# Patient Record
Sex: Female | Born: 1980
Health system: Southern US, Community
[De-identification: ages and names within clinical notes are randomized; demographics above are authoritative.]

## PROBLEM LIST (undated history)

## (undated) DIAGNOSIS — R52 Pain, unspecified: Secondary | ICD-10-CM

## (undated) DIAGNOSIS — U071 COVID-19: Secondary | ICD-10-CM

## (undated) DIAGNOSIS — M5430 Sciatica, unspecified side: Secondary | ICD-10-CM

## (undated) DIAGNOSIS — G43909 Migraine, unspecified, not intractable, without status migrainosus: Secondary | ICD-10-CM

## (undated) DIAGNOSIS — M5126 Other intervertebral disc displacement, lumbar region: Secondary | ICD-10-CM

## (undated) DIAGNOSIS — F909 Attention-deficit hyperactivity disorder, unspecified type: Secondary | ICD-10-CM

## (undated) DIAGNOSIS — I10 Essential (primary) hypertension: Secondary | ICD-10-CM

## (undated) HISTORY — DX: Pain, unspecified: R52

## (undated) HISTORY — DX: Migraine, unspecified, not intractable, without status migrainosus: G43.909

## (undated) HISTORY — PX: TUBAL LIGATION: SHX77

## (undated) HISTORY — DX: COVID-19: U07.1

## (undated) HISTORY — DX: Sciatica, unspecified side: M54.30

## (undated) HISTORY — DX: Other intervertebral disc displacement, lumbar region: M51.26

## (undated) HISTORY — DX: Attention-deficit hyperactivity disorder, unspecified type: F90.9

## (undated) HISTORY — PX: HYSTEROSCOPY: SHX211

## (undated) HISTORY — PX: KNEE ARTHROSCOPY: SUR90

---

## 1998-03-22 ENCOUNTER — Ambulatory Visit (HOSPITAL_COMMUNITY): Admission: RE | Admit: 1998-03-22 | Discharge: 1998-03-22 | Payer: Self-pay | Admitting: Obstetrics

## 1998-04-15 ENCOUNTER — Inpatient Hospital Stay (HOSPITAL_COMMUNITY): Admission: AD | Admit: 1998-04-15 | Discharge: 1998-04-15 | Payer: Self-pay | Admitting: *Deleted

## 1998-07-30 ENCOUNTER — Ambulatory Visit (HOSPITAL_COMMUNITY): Admission: RE | Admit: 1998-07-30 | Discharge: 1998-07-30 | Payer: Self-pay | Admitting: Obstetrics

## 1998-08-08 ENCOUNTER — Inpatient Hospital Stay (HOSPITAL_COMMUNITY): Admission: AD | Admit: 1998-08-08 | Discharge: 1998-08-11 | Payer: Self-pay | Admitting: Obstetrics

## 1998-08-12 ENCOUNTER — Inpatient Hospital Stay (HOSPITAL_COMMUNITY): Admission: AD | Admit: 1998-08-12 | Discharge: 1998-08-12 | Payer: Self-pay | Admitting: Obstetrics

## 2002-09-17 ENCOUNTER — Emergency Department (HOSPITAL_COMMUNITY): Admission: EM | Admit: 2002-09-17 | Discharge: 2002-09-17 | Payer: Self-pay | Admitting: Emergency Medicine

## 2004-04-19 ENCOUNTER — Emergency Department (HOSPITAL_COMMUNITY): Admission: EM | Admit: 2004-04-19 | Discharge: 2004-04-19 | Payer: Self-pay | Admitting: Family Medicine

## 2005-04-05 ENCOUNTER — Emergency Department: Payer: Self-pay | Admitting: Unknown Physician Specialty

## 2005-07-23 ENCOUNTER — Emergency Department: Payer: Self-pay | Admitting: Emergency Medicine

## 2009-02-13 ENCOUNTER — Ambulatory Visit: Payer: Self-pay | Admitting: Unknown Physician Specialty

## 2009-08-03 ENCOUNTER — Ambulatory Visit: Payer: Self-pay | Admitting: Obstetrics and Gynecology

## 2009-08-14 ENCOUNTER — Ambulatory Visit: Payer: Self-pay | Admitting: Anesthesiology

## 2009-10-04 ENCOUNTER — Ambulatory Visit: Payer: Self-pay | Admitting: Anesthesiology

## 2009-11-15 ENCOUNTER — Ambulatory Visit: Payer: Self-pay | Admitting: Anesthesiology

## 2010-01-04 ENCOUNTER — Ambulatory Visit: Payer: Self-pay | Admitting: Anesthesiology

## 2010-04-18 ENCOUNTER — Ambulatory Visit: Payer: Self-pay | Admitting: Anesthesiology

## 2010-06-28 ENCOUNTER — Ambulatory Visit: Payer: Self-pay | Admitting: Anesthesiology

## 2010-12-02 ENCOUNTER — Ambulatory Visit: Payer: Self-pay | Admitting: Anesthesiology

## 2016-09-16 DIAGNOSIS — J4540 Moderate persistent asthma, uncomplicated: Secondary | ICD-10-CM | POA: Diagnosis not present

## 2016-11-03 DIAGNOSIS — Z01419 Encounter for gynecological examination (general) (routine) without abnormal findings: Secondary | ICD-10-CM | POA: Diagnosis not present

## 2016-11-03 DIAGNOSIS — Z124 Encounter for screening for malignant neoplasm of cervix: Secondary | ICD-10-CM | POA: Diagnosis not present

## 2016-11-03 DIAGNOSIS — Z13220 Encounter for screening for lipoid disorders: Secondary | ICD-10-CM | POA: Diagnosis not present

## 2016-11-03 DIAGNOSIS — Z131 Encounter for screening for diabetes mellitus: Secondary | ICD-10-CM | POA: Diagnosis not present

## 2016-11-03 LAB — HM PAP SMEAR: HM PAP: NEGATIVE

## 2016-12-17 DIAGNOSIS — J40 Bronchitis, not specified as acute or chronic: Secondary | ICD-10-CM | POA: Diagnosis not present

## 2017-03-21 DIAGNOSIS — H52223 Regular astigmatism, bilateral: Secondary | ICD-10-CM | POA: Diagnosis not present

## 2017-08-27 ENCOUNTER — Ambulatory Visit: Payer: Self-pay | Admitting: Physician Assistant

## 2017-08-27 DIAGNOSIS — S92502A Displaced unspecified fracture of left lesser toe(s), initial encounter for closed fracture: Secondary | ICD-10-CM | POA: Diagnosis not present

## 2017-09-11 DIAGNOSIS — S92502A Displaced unspecified fracture of left lesser toe(s), initial encounter for closed fracture: Secondary | ICD-10-CM | POA: Diagnosis not present

## 2017-09-14 ENCOUNTER — Ambulatory Visit: Payer: Self-pay | Admitting: Physician Assistant

## 2017-09-14 ENCOUNTER — Encounter: Payer: Self-pay | Admitting: Physician Assistant

## 2017-09-14 VITALS — BP 160/92 | HR 74 | Temp 97.5°F

## 2017-09-14 DIAGNOSIS — J069 Acute upper respiratory infection, unspecified: Secondary | ICD-10-CM

## 2017-09-14 MED ORDER — FLUCONAZOLE 150 MG PO TABS: ORAL_TABLET | ORAL | 0 refills | Status: DC

## 2017-09-14 MED ORDER — AZITHROMYCIN 250 MG PO TABS: ORAL_TABLET | ORAL | 0 refills | Status: DC

## 2017-09-14 MED ORDER — PREDNISONE 10 MG (21) PO TBPK: ORAL_TABLET | ORAL | 0 refills | Status: DC

## 2017-09-14 NOTE — Progress Notes (Signed)
S: C/o runny nose and congestion for 3 -4 days, no fever, chills, cp/sob, v/d; mucus was green this am, has ear pain, cough is harsh, going on a cruise in 5 days, worried she will be sick while on the cruise  Using otc meds: dayquil/nyquil  O: PE: vitals wnl, nad, perrl eomi, normocephalic, tms dull, nasal mucosa red and swollen, throat injected, neck supple no lymph, lungs c t a, cv rrr, neuro intact, cough is harsh  A:  Acute  uri   P: drink fluids, continue regular meds , use otc meds of choice, return if not improving in 5 days, return earlier if worsening , zpack, sterapred ds 10mg  6 day dose pack, diflucan

## 2017-09-28 DIAGNOSIS — M533 Sacrococcygeal disorders, not elsewhere classified: Secondary | ICD-10-CM | POA: Insufficient documentation

## 2017-09-28 DIAGNOSIS — S92502A Displaced unspecified fracture of left lesser toe(s), initial encounter for closed fracture: Secondary | ICD-10-CM | POA: Diagnosis not present

## 2017-12-15 NOTE — Progress Notes (Signed)
Subjective:    Patient ID: Sylvia Warren, female    DOB: [birth year], 37 y.o.   MRN: [id number]  HPI:  Sylvia Warren is here to establish as a new pt.  She is a pleasant 37 year old female.  PMH: Migraine HA, Obesity, elevated BP, and tobacco use disorder.  Her BP has been >140/90 the last few encounters with healthcare providers and even caused blurred vision a few weeks ago.  She estimates to drink "pot of coffe/day" and smokes pack/day for the >18 years.  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 She previously worked 7p-7a for >10 years She eats a diet high in saturated fat/sugar/Na+ and denies any regular exercise- however will stand/walk quite a lot while at work. She denies issues with depression/anxiety and reports restful sleep. She is married with 3 children at home, ages 53,18,15   Patient Care Team    Relationship Specialty Notifications Start End  Dunmor, Jinny Blossom, NP PCP - General Family Medicine  12/16/17     There are no active problems to display for this patient.    History reviewed. No pertinent past medical history.   Past Surgical History:  Procedure Laterality Date  . CESAREAN SECTION    . KNEE ARTHROSCOPY    . TUBAL LIGATION       Family History  Problem Relation Age of Onset  . Hypertension Mother   . Cirrhosis Mother   . Kidney disease Mother   . Healthy Sister   . Healthy Daughter   . Healthy Son   . Cancer Maternal Aunt        vulvular  . Diabetes Paternal Grandmother   . Healthy Sister   . Healthy Sister   . Healthy Son      Social History   Substance and Sexual Activity  Drug Use No     Social History   Substance and Sexual Activity  Alcohol Use No  . Frequency: Never     Social History   Tobacco Use  Smoking Status Current Every Day Smoker  . Packs/day: 1.00  . Years: 18.00  . Pack years: 18.00  . Types: Cigarettes  Smokeless Tobacco Never Used      Outpatient Encounter Medications as of 12/16/2017  Medication Sig  . rizatriptan (MAXALT) 5 MG tablet Take 5 mg by mouth as needed for migraine. May repeat in 2 hours if needed  . [DISCONTINUED] azithromycin (ZITHROMAX Z-PAK) 250 MG tablet 2 pills today then 1 pill a day for 4 days  . [DISCONTINUED] fluconazole (DIFLUCAN) 150 MG tablet Take one now and one in a week  . [DISCONTINUED] HYDROcodone-acetaminophen (NORCO/VICODIN) 5-325 MG tablet   . [DISCONTINUED] meloxicam (MOBIC) 15 MG tablet Mobic 15 mg tablet  Take 1 tablet every day by oral route.  . [DISCONTINUED] predniSONE (STERAPRED UNI-PAK 21 TAB) 10 MG (21) TBPK tablet Take 6 pills on day one then decrease by 1 pill each day   No facility-administered encounter medications on file as of 12/16/2017.     Allergies: Patient has no known allergies.  Body mass index is 38.75 kg/m.  Blood pressure (!) 150/91, pulse 75, height 5' 6.25" (1.683 m), weight 241 lb 14.4 oz (109.7 kg), last menstrual period 11/26/2017, SpO2 98 %.    Review of Systems  Constitutional: Positive for fatigue. Negative for activity change, appetite change, chills, diaphoresis, fever and unexpected weight change.  HENT: Negative for congestion.   Eyes:  Positive for visual disturbance.  Respiratory: Negative for cough, chest tightness, shortness of breath, wheezing and stridor.   Cardiovascular: Negative for chest pain, palpitations and leg swelling.  Gastrointestinal: Negative for abdominal distention, abdominal pain, blood in stool, constipation, diarrhea, nausea and vomiting.  Endocrine: Negative for cold intolerance, heat intolerance, polydipsia, polyphagia and polyuria.  Genitourinary: Negative for difficulty urinating and flank pain.  Skin: Negative for color change, pallor, rash and wound.  Neurological: Positive for headaches. Negative for dizziness.  Psychiatric/Behavioral: Negative for decreased concentration, dysphoric mood, hallucinations,  self-injury, sleep disturbance and suicidal ideas. The patient is not nervous/anxious and is not hyperactive.        Objective:   Physical Exam  Constitutional: She is oriented to person, place, and time. She appears well-developed and well-nourished. No distress.  HENT:  Head: Normocephalic and atraumatic.  Right Ear: External ear normal.  Left Ear: External ear normal.  Eyes: Conjunctivae are normal. Pupils are equal, round, and reactive to light.  Cardiovascular: Normal rate, regular rhythm, normal heart sounds and intact distal pulses.  No murmur heard. Pulmonary/Chest: Effort normal and breath sounds normal. No respiratory distress. She has no wheezes. She has no rales. She exhibits no tenderness.  Neurological: She is alert and oriented to person, place, and time.  Skin: Skin is warm and dry. No rash noted. She is not diaphoretic. No erythema. No pallor.  Psychiatric: She has a normal mood and affect. Her behavior is normal. Judgment and thought content normal.  Nursing note and vitals reviewed.         Assessment & Plan:   1. Elevated blood pressure reading   2. HTN, goal below 140/90   3. Tobacco use disorder   4. Healthcare maintenance     HTN, goal below 140/90 BP 150/91, HR 75 Her BPs have been running well over goal the last 18months Last BP listed in system 10/18- 160/92, HR 74 CMP drawn today Started on Lisinopril 10mg  daily Encouraged to stop tobacco use and reduce Na+ and caffeine intake.   Healthcare maintenance Please start Lisinopril 10mg  daily and check your BP at least once/day-please bring log to follow-up in 4 weeks. Please use Nicoderm as directed. Increase water intake, strive for at least 120 ounces/day.   Follow Heart Healthy diet Increase regular exercise, recommend  Low impact activities- walking, stretching,  YouTube/Pinterest workout videos. Please schedule fasting lab appt in 3 weeks and physical in 4 weeks.  Tobacco use  disorder Start Nicoderm patch     FOLLOW-UP:  Return in about 4 weeks (around 01/13/2018) for CPE.

## 2017-12-16 ENCOUNTER — Encounter: Payer: Self-pay | Admitting: Adult Health

## 2017-12-16 ENCOUNTER — Ambulatory Visit (INDEPENDENT_AMBULATORY_CARE_PROVIDER_SITE_OTHER): Payer: No Typology Code available for payment source | Admitting: Adult Health

## 2017-12-16 VITALS — BP 150/91 | HR 75 | Ht 66.25 in | Wt 241.9 lb

## 2017-12-16 DIAGNOSIS — F17200 Nicotine dependence, unspecified, uncomplicated: Secondary | ICD-10-CM | POA: Diagnosis not present

## 2017-12-16 DIAGNOSIS — I10 Essential (primary) hypertension: Secondary | ICD-10-CM | POA: Diagnosis not present

## 2017-12-16 DIAGNOSIS — R030 Elevated blood-pressure reading, without diagnosis of hypertension: Secondary | ICD-10-CM | POA: Diagnosis not present

## 2017-12-16 DIAGNOSIS — Z0000 Encounter for general adult medical examination without abnormal findings: Secondary | ICD-10-CM | POA: Diagnosis not present

## 2017-12-16 MED ORDER — NICOTINE 14 MG/24HR TD PT24
14.0000 mg | MEDICATED_PATCH | Freq: Every day | TRANSDERMAL | 0 refills | Status: DC
Start: 2017-12-16 — End: 2018-06-16

## 2017-12-16 MED ORDER — LISINOPRIL 10 MG PO TABS: 10.0000 mg | ORAL_TABLET | Freq: Every day | ORAL | 0 refills | Status: DC

## 2017-12-16 NOTE — Assessment & Plan Note (Signed)
Please start Lisinopril 10mg  daily and check your BP at least once/day-please bring log to follow-up in 4 weeks. Please use Nicoderm as directed. Increase water intake, strive for at least 120 ounces/day.   Follow Heart Healthy diet Increase regular exercise, recommend  Low impact activities- walking, stretching,  YouTube/Pinterest workout videos. Please schedule fasting lab appt in 3 weeks and physical in 4 weeks.

## 2017-12-16 NOTE — Assessment & Plan Note (Signed)
Start Nicoderm patch  

## 2017-12-16 NOTE — Assessment & Plan Note (Signed)
BP 150/91, HR 75 Her BPs have been running well over goal the last 18months Last BP listed in system 10/18- 160/92, HR 74 CMP drawn today Started on Lisinopril 10mg  daily Encouraged to stop tobacco use and reduce Na+ and caffeine intake.

## 2017-12-16 NOTE — Patient Instructions (Signed)
Heart-Healthy Eating Plan Many factors influence your heart health, including eating and exercise habits. Heart (coronary) risk increases with abnormal blood fat (lipid) levels. Heart-healthy meal planning includes limiting unhealthy fats, increasing healthy fats, and making other small dietary changes. This includes maintaining a healthy body weight to help keep lipid levels within a normal range. What is my plan? Your health care provider recommends that you:  Get no more than ___25__% of the total calories in your daily diet from fat.  Limit your intake of saturated fat to less than ___5__% of your total calories each day.  Limit the amount of cholesterol in your diet to less than _300___ mg per day.  What types of fat should I choose?  Choose healthy fats more often. Choose monounsaturated and polyunsaturated fats, such as olive oil and canola oil, flaxseeds, walnuts, almonds, and seeds.  Eat more omega-3 fats. Good choices include salmon, mackerel, sardines, tuna, flaxseed oil, and ground flaxseeds. Aim to eat fish at least two times each week.  Limit saturated fats. Saturated fats are primarily found in animal products, such as meats, butter, and cream. Plant sources of saturated fats include palm oil, palm kernel oil, and coconut oil.  Avoid foods with partially hydrogenated oils in them. These contain trans fats. Examples of foods that contain trans fats are stick margarine, some tub margarines, cookies, crackers, and other baked goods. What general guidelines do I need to follow?  Check food labels carefully to identify foods with trans fats or high amounts of saturated fat.  Fill one half of your plate with vegetables and green salads. Eat 4-5 servings of vegetables per day. A serving of vegetables equals 1 cup of raw leafy vegetables,  cup of raw or cooked cut-up vegetables, or  cup of vegetable juice.  Fill one fourth of your plate with whole grains. Look for the word "whole"  as the first word in the ingredient list.  Fill one fourth of your plate with lean protein foods.  Eat 4-5 servings of fruit per day. A serving of fruit equals one medium whole fruit,  cup of dried fruit,  cup of fresh, frozen, or canned fruit, or  cup of 100% fruit juice.  Eat more foods that contain soluble fiber. Examples of foods that contain this type of fiber are apples, broccoli, carrots, beans, peas, and barley. Aim to get 20-30 g of fiber per day.  Eat more home-cooked food and less restaurant, buffet, and fast food.  Limit or avoid alcohol.  Limit foods that are high in starch and sugar.  Avoid fried foods.  Cook foods by using methods other than frying. Baking, boiling, grilling, and broiling are all great options. Other fat-reducing suggestions include: ? Removing the skin from poultry. ? Removing all visible fats from meats. ? Skimming the fat off of stews, soups, and gravies before serving them. ? Steaming vegetables in water or broth.  Lose weight if you are overweight. Losing just 5-10% of your initial body weight can help your overall health and prevent diseases such as diabetes and heart disease.  Increase your consumption of nuts, legumes, and seeds to 4-5 servings per week. One serving of dried beans or legumes equals  cup after being cooked, one serving of nuts equals 1 ounces, and one serving of seeds equals  ounce or 1 tablespoon.  You may need to monitor your salt (sodium) intake, especially if you have high blood pressure. Talk with your health care provider or dietitian to get  more information about reducing sodium. What foods can I eat? Grains  Breads, including [place], white, pita, wheat, raisin, rye, oatmeal, and [place]. Tortillas that are neither fried nor made with lard or trans fat. Low-fat rolls, including hotdog and hamburger buns and English muffins. Biscuits. Muffins. Waffles. Pancakes. Light popcorn. Whole-grain cereals. Flatbread. Melba  toast. Pretzels. Breadsticks. Rusks. Low-fat snacks and crackers, including oyster, saltine, matzo, graham, animal, and rye. Rice and pasta, including brown rice and those that are made with whole wheat. Vegetables All vegetables. Fruits All fruits, but limit coconut. Meats and Other Protein Sources Lean, well-trimmed beef, veal, pork, and lamb. Chicken and [place] without skin. All fish and shellfish. Wild duck, rabbit, pheasant, and venison. Egg whites or low-cholesterol egg substitutes. Dried beans, peas, lentils, and tofu.Seeds and most nuts. Dairy Low-fat or nonfat cheeses, including ricotta, string, and mozzarella. Skim or 1% milk that is liquid, powdered, or evaporated. Buttermilk that is made with low-fat milk. Nonfat or low-fat yogurt. Beverages Mineral water. Diet carbonated beverages. Sweets and Desserts Sherbets and fruit ices. Honey, jam, marmalade, jelly, and syrups. Meringues and gelatins. Pure sugar candy, such as hard candy, jelly beans, gumdrops, mints, marshmallows, and small amounts of dark chocolate. [employer] Eat all sweets and desserts in moderation. Fats and Oils Nonhydrogenated (trans-free) margarines. Vegetable oils, including soybean, sesame, sunflower, olive, peanut, safflower, corn, canola, and cottonseed. Salad dressings or mayonnaise that are made with a vegetable oil. Limit added fats and oils that you use for cooking, baking, salads, and as spreads. Other Cocoa powder. Coffee and tea. All seasonings and condiments. The items listed above may not be a complete list of recommended foods or beverages. Contact your dietitian for more options. What foods are not recommended? Grains Breads that are made with saturated or trans fats, oils, or whole milk. Croissants. Butter rolls. Cheese breads. Sweet rolls. Donuts. Buttered popcorn. Chow mein noodles. High-fat crackers, such as cheese or butter crackers. Meats and Other Protein Sources Fatty meats, such as  hotdogs, short ribs, sausage, spareribs, bacon, ribeye roast or steak, and mutton. High-fat deli meats, such as salami and bologna. Caviar. Domestic duck and goose. Organ meats, such as kidney, liver, sweetbreads, brains, gizzard, chitterlings, and heart. Dairy Cream, sour cream, cream cheese, and creamed cottage cheese. Whole milk cheeses, including blue (bleu), Monterey Jack, [place], [place], American, [place], Swiss, [place], [place], and [place]. Whole or 2% milk that is liquid, evaporated, or condensed. Whole buttermilk. Cream sauce or high-fat cheese sauce. Yogurt that is made from whole milk. Beverages Regular sodas and drinks with added sugar. Sweets and Desserts Frosting. Pudding. Cookies. Cakes other than angel food cake. Candy that has milk chocolate or white chocolate, hydrogenated fat, butter, coconut, or unknown ingredients. Buttered syrups. Full-fat ice cream or ice cream drinks. Fats and Oils Gravy that has suet, meat fat, or shortening. Cocoa butter, hydrogenated oils, palm oil, coconut oil, palm kernel oil. These can often be found in baked products, candy, fried foods, nondairy creamers, and whipped toppings. Solid fats and shortenings, including bacon fat, salt pork, lard, and butter. Nondairy cream substitutes, such as coffee creamers and sour cream substitutes. Salad dressings that are made of unknown oils, cheese, or sour cream. The items listed above may not be a complete list of foods and beverages to avoid. Contact your dietitian for more information. This information is not intended to replace advice given to you by your health care provider. Make sure you discuss any questions you have with your health care  provider. Document Released: 08/12/2008 Document Revised: 05/23/2016 Document Reviewed: 04/27/2014 Elsevier Interactive Patient Education  2018 [employer].   Hypertension Hypertension, commonly called high blood pressure, is when the force of blood pumping through the  arteries is too strong. The arteries are the blood vessels that carry blood from the heart throughout the body. Hypertension forces the heart to work harder to pump blood and may cause arteries to become narrow or stiff. Having untreated or uncontrolled hypertension can cause heart attacks, strokes, kidney disease, and other problems. A blood pressure reading consists of a higher number over a lower number. Ideally, your blood pressure should be below 120/80. The first ("top") number is called the systolic pressure. It is a measure of the pressure in your arteries as your heart beats. The second ("bottom") number is called the diastolic pressure. It is a measure of the pressure in your arteries as the heart relaxes. What are the causes? The cause of this condition is not known. What increases the risk? Some risk factors for high blood pressure are under your control. Others are not. Factors you can change  Smoking.  Having type 2 diabetes mellitus, high cholesterol, or both.  Not getting enough exercise or physical activity.  Being overweight.  Having too much fat, sugar, calories, or salt (sodium) in your diet.  Drinking too much alcohol. Factors that are difficult or impossible to change  Having chronic kidney disease.  Having a family history of high blood pressure.  Age. Risk increases with age.  Race. You may be at higher risk if you are African-American.  Gender. Men are at higher risk than women before age 58. After age 61, women are at higher risk than men.  Having obstructive sleep apnea.  Stress. What are the signs or symptoms? Extremely high blood pressure (hypertensive crisis) may cause:  Headache.  Anxiety.  Shortness of breath.  Nosebleed.  Nausea and vomiting.  Severe chest pain.  Jerky movements you cannot control (seizures).  How is this diagnosed? This condition is diagnosed by measuring your blood pressure while you are seated, with your arm  resting on a surface. The cuff of the blood pressure monitor will be placed directly against the skin of your upper arm at the level of your heart. It should be measured at least twice using the same arm. Certain conditions can cause a difference in blood pressure between your right and left arms. Certain factors can cause blood pressure readings to be lower or higher than normal (elevated) for a short period of time:  When your blood pressure is higher when you are in a health care provider's office than when you are at home, this is called white coat hypertension. Most people with this condition do not need medicines.  When your blood pressure is higher at home than when you are in a health care provider's office, this is called masked hypertension. Most people with this condition may need medicines to control blood pressure.  If you have a high blood pressure reading during one visit or you have normal blood pressure with other risk factors:  You may be asked to return on a different day to have your blood pressure checked again.  You may be asked to monitor your blood pressure at home for 1 week or longer.  If you are diagnosed with hypertension, you may have other blood or imaging tests to help your health care provider understand your overall risk for other conditions. How is this treated? This  condition is treated by making healthy lifestyle changes, such as eating healthy foods, exercising more, and reducing your alcohol intake. Your health care provider may prescribe medicine if lifestyle changes are not enough to get your blood pressure under control, and if:  Your systolic blood pressure is above 130.  Your diastolic blood pressure is above 80.  Your personal target blood pressure may vary depending on your medical conditions, your age, and other factors. Follow these instructions at home: Eating and drinking  Eat a diet that is high in fiber and potassium, and low in sodium,  added sugar, and fat. An example eating plan is called the DASH (Dietary Approaches to Stop Hypertension) diet. To eat this way: ? Eat plenty of fresh fruits and vegetables. Try to fill half of your plate at each meal with fruits and vegetables. ? Eat whole grains, such as whole wheat pasta, brown rice, or whole grain bread. Fill about one quarter of your plate with whole grains. ? Eat or drink low-fat dairy products, such as skim milk or low-fat yogurt. ? Avoid fatty cuts of meat, processed or cured meats, and poultry with skin. Fill about one quarter of your plate with lean proteins, such as fish, chicken without skin, beans, eggs, and tofu. ? Avoid premade and processed foods. These tend to be higher in sodium, added sugar, and fat.  Reduce your daily sodium intake. Most people with hypertension should eat less than 1,500 mg of sodium a day.  Limit alcohol intake to no more than 1 drink a day for nonpregnant women and 2 drinks a day for men. One drink equals 12 oz of beer, 5 oz of wine, or 1 oz of hard liquor. Lifestyle  Work with your health care provider to maintain a healthy body weight or to lose weight. Ask what an ideal weight is for you.  Get at least 30 minutes of exercise that causes your heart to beat faster (aerobic exercise) most days of the week. Activities may include walking, swimming, or biking.  Include exercise to strengthen your muscles (resistance exercise), such as pilates or lifting weights, as part of your weekly exercise routine. Try to do these types of exercises for 30 minutes at least 3 days a week.  Do not use any products that contain nicotine or tobacco, such as cigarettes and e-cigarettes. If you need help quitting, ask your health care provider.  Monitor your blood pressure at home as told by your health care provider.  Keep all follow-up visits as told by your health care provider. This is important. Medicines  Take over-the-counter and prescription  medicines only as told by your health care provider. Follow directions carefully. Blood pressure medicines must be taken as prescribed.  Do not skip doses of blood pressure medicine. Doing this puts you at risk for problems and can make the medicine less effective.  Ask your health care provider about side effects or reactions to medicines that you should watch for. Contact a health care provider if:  You think you are having a reaction to a medicine you are taking.  You have headaches that keep coming back (recurring).  You feel dizzy.  You have swelling in your ankles.  You have trouble with your vision. Get help right away if:  You develop a severe headache or confusion.  You have unusual weakness or numbness.  You feel faint.  You have severe pain in your chest or abdomen.  You vomit repeatedly.  You have trouble breathing.  Summary  Hypertension is when the force of blood pumping through your arteries is too strong. If this condition is not controlled, it may put you at risk for serious complications.  Your personal target blood pressure may vary depending on your medical conditions, your age, and other factors. For most people, a normal blood pressure is less than 120/80.  Hypertension is treated with lifestyle changes, medicines, or a combination of both. Lifestyle changes include weight loss, eating a healthy, low-sodium diet, exercising more, and limiting alcohol. This information is not intended to replace advice given to you by your health care provider. Make sure you discuss any questions you have with your health care provider. Document Released: 11/03/2005 Document Revised: 10/01/2016 Document Reviewed: 10/01/2016 Elsevier Interactive Patient Education  2018 [employer]  Please start Lisinopril 10mg  daily and check your BP at least once/day-please bring log to follow-up in 4 weeks. Please use Nicoderm as directed. Increase water intake, strive for at least  120 ounces/day.   Follow Heart Healthy diet Increase regular exercise, recommend  Low impact activities- walking, stretching,  YouTube/Pinterest workout videos. Please schedule fasting lab appt in 3 weeks and physical in 4 weeks. WELCOME TO THE PRACTICE!

## 2017-12-17 LAB — COMPREHENSIVE METABOLIC PANEL
ALT: 19 IU/L (ref 0–32)
AST: 13 IU/L (ref 0–40)
Albumin/Globulin Ratio: 1.6 (ref 1.2–2.2)
Albumin: 4.2 g/dL (ref 3.5–5.5)
Alkaline Phosphatase: 72 IU/L (ref 39–117)
BUN/Creatinine Ratio: 10 (ref 9–23)
BUN: 6 mg/dL (ref 6–20)
Bilirubin Total: 0.3 mg/dL (ref 0.0–1.2)
CO2: 22 mmol/L (ref 20–29)
Calcium: 9.6 mg/dL (ref 8.7–10.2)
Chloride: 100 mmol/L (ref 96–106)
Creatinine, Ser: 0.62 mg/dL (ref 0.57–1.00)
GFR calc Af Amer: 132 mL/min/{1.73_m2} (ref >59)
GFR calc non Af Amer: 115 mL/min/{1.73_m2} (ref >59)
Globulin, Total: 2.6 g/dL (ref 1.5–4.5)
Glucose: 91 mg/dL (ref 65–99)
Potassium: 4.3 mmol/L (ref 3.5–5.2)
Sodium: 139 mmol/L (ref 134–144)
Total Protein: 6.8 g/dL (ref 6.0–8.5)

## 2018-01-07 ENCOUNTER — Other Ambulatory Visit: Payer: Self-pay

## 2018-01-07 ENCOUNTER — Other Ambulatory Visit: Payer: No Typology Code available for payment source

## 2018-01-07 DIAGNOSIS — I10 Essential (primary) hypertension: Secondary | ICD-10-CM

## 2018-01-07 DIAGNOSIS — Z131 Encounter for screening for diabetes mellitus: Secondary | ICD-10-CM

## 2018-01-07 DIAGNOSIS — R899 Unspecified abnormal finding in specimens from other organs, systems and tissues: Secondary | ICD-10-CM

## 2018-01-07 DIAGNOSIS — Z1329 Encounter for screening for other suspected endocrine disorder: Secondary | ICD-10-CM

## 2018-01-07 DIAGNOSIS — Z0000 Encounter for general adult medical examination without abnormal findings: Secondary | ICD-10-CM

## 2018-01-08 ENCOUNTER — Other Ambulatory Visit: Payer: Self-pay | Admitting: Adult Health

## 2018-01-08 ENCOUNTER — Other Ambulatory Visit: Payer: Self-pay

## 2018-01-08 DIAGNOSIS — E559 Vitamin D deficiency, unspecified: Secondary | ICD-10-CM

## 2018-01-08 LAB — COMPREHENSIVE METABOLIC PANEL
A/G RATIO: 1.3 (ref 1.2–2.2)
ALBUMIN: 4.1 g/dL (ref 3.5–5.5)
ALT: 22 IU/L (ref 0–32)
AST: 14 IU/L (ref 0–40)
Alkaline Phosphatase: 76 IU/L (ref 39–117)
BUN / CREAT RATIO: 13 (ref 9–23)
BUN: 9 mg/dL (ref 6–20)
CHLORIDE: 105 mmol/L (ref 96–106)
CO2: 20 mmol/L (ref 20–29)
Calcium: 9.5 mg/dL (ref 8.7–10.2)
Creatinine, Ser: 0.67 mg/dL (ref 0.57–1.00)
GFR calc non Af Amer: 112 mL/min/{1.73_m2} (ref >59)
GFR, EST AFRICAN AMERICAN: 129 mL/min/{1.73_m2} (ref >59)
GLUCOSE: 92 mg/dL (ref 65–99)
Globulin, Total: 3.1 g/dL (ref 1.5–4.5)
POTASSIUM: 4.6 mmol/L (ref 3.5–5.2)
Sodium: 140 mmol/L (ref 134–144)
TOTAL PROTEIN: 7.2 g/dL (ref 6.0–8.5)

## 2018-01-08 LAB — CBC WITH DIFFERENTIAL/PLATELET
BASOS ABS: 0 10*3/uL (ref 0.0–0.2)
BASOS: 1 %
EOS (ABSOLUTE): 0.3 10*3/uL (ref 0.0–0.4)
Eos: 4 %
HEMOGLOBIN: 15 g/dL (ref 11.1–15.9)
Hematocrit: 42.8 % (ref 34.0–46.6)
IMMATURE GRANS (ABS): 0 10*3/uL (ref 0.0–0.1)
Immature Granulocytes: 0 %
LYMPHS ABS: 2.9 10*3/uL (ref 0.7–3.1)
LYMPHS: 44 %
MCH: 30.2 pg (ref 26.6–33.0)
MCHC: 35 g/dL (ref 31.5–35.7)
MCV: 86 fL (ref 79–97)
Monocytes Absolute: 1 10*3/uL — ABNORMAL HIGH (ref 0.1–0.9)
Monocytes: 14 %
NEUTROS ABS: 2.4 10*3/uL (ref 1.4–7.0)
Neutrophils: 37 %
PLATELETS: 364 10*3/uL (ref 150–379)
RBC: 4.96 x10E6/uL (ref 3.77–5.28)
RDW: 14.2 % (ref 12.3–15.4)
WBC: 6.6 10*3/uL (ref 3.4–10.8)

## 2018-01-08 LAB — HEMOGLOBIN A1C
ESTIMATED AVERAGE GLUCOSE: 117 mg/dL
Hgb A1c MFr Bld: 5.7 % — ABNORMAL HIGH (ref 4.8–5.6)

## 2018-01-08 LAB — LIPID PANEL
CHOLESTEROL TOTAL: 183 mg/dL (ref 100–199)
Chol/HDL Ratio: 5.2 ratio — ABNORMAL HIGH (ref 0.0–4.4)
HDL: 35 mg/dL — ABNORMAL LOW (ref >39)
LDL Calculated: 124 mg/dL — ABNORMAL HIGH (ref 0–99)
Triglycerides: 122 mg/dL (ref 0–149)
VLDL Cholesterol Cal: 24 mg/dL (ref 5–40)

## 2018-01-08 LAB — TSH: TSH: 2.22 u[IU]/mL (ref 0.450–4.500)

## 2018-01-08 LAB — VITAMIN D 25 HYDROXY (VIT D DEFICIENCY, FRACTURES): VIT D 25 HYDROXY: 18.3 ng/mL — AB (ref 30.0–100.0)

## 2018-01-08 MED ORDER — VITAMIN D (ERGOCALCIFEROL) 1.25 MG (50000 UNIT) PO CAPS: 50000.0000 [IU] | ORAL_CAPSULE | ORAL | 0 refills | Status: DC

## 2018-01-13 ENCOUNTER — Telehealth: Payer: Self-pay | Admitting: Adult Health

## 2018-01-13 NOTE — Telephone Encounter (Signed)
Patient states was left message to call office by medical assistant--- forwarding to MA as FY.  --glh

## 2018-01-13 NOTE — Telephone Encounter (Signed)
See documentation on result notes.  Tiajuana Amass. Nelson, CMA

## 2018-01-21 ENCOUNTER — Encounter: Payer: Self-pay | Admitting: Adult Health

## 2018-02-09 NOTE — Progress Notes (Signed)
Subjective:    Patient ID: Sylvia Warren, female    DOB: 08/11/1981, 37 y.o.   MRN: [id number]  HPI:  12/16/17 OV: [name] is here to establish as a new pt.  She is a pleasant 37 year old female.  PMH: Migraine HA, Obesity, elevated BP, and tobacco use disorder.  Her BP has been >140/90 the last few encounters with healthcare providers and even caused blurred vision a few weeks ago.  She estimates to drink "pot of coffe/day" and smokes pack/day for the >18 years.  She recently finished nursing school jun 2018 and now works [employer] at [employer] [place] [employer]. She previously worked 7p-7a for >10 years She eats a diet high in saturated fat/sugar/Na+ and denies any regular exercise- however will stand/walk quite a lot while at work. She denies issues with depression/anxiety and reports restful sleep. She is married with 3 children at home, ages 19,18,15  02/10/18 OV: [name] presents for CPE. She reports medication compliance, denies SE. She has been increasing regular walking and slowly reducing saturated fat/CHO/sugat intake. She has not started using nicoderm patches "I am not ready to quit". 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 Vitamin D deficiency 02/10/2018  . Migraine with status migrainosus, not intractable 02/10/2018  . Elevated blood pressure reading 12/16/2017  . HTN, goal below 140/90 12/16/2017  . Tobacco use disorder 12/16/2017  . Healthcare maintenance 12/16/2017     No past medical history on file.   Past Surgical History:  Procedure Laterality  Date  . CESAREAN SECTION    . KNEE ARTHROSCOPY    . TUBAL LIGATION       Family History  Problem Relation Age of Onset  . Hypertension Mother   . Cirrhosis Mother   . Kidney disease Mother   . Healthy Sister   . Healthy Daughter   . Healthy Son   . Cancer Maternal Aunt        vulvular  . Diabetes Paternal Grandmother   . Healthy Sister   . Healthy Sister   . Healthy Son      Social History   Substance and Sexual Activity  Drug Use No     Social History   Substance and Sexual Activity  Alcohol Use No  . Frequency: Never     Social History   Tobacco Use  Smoking Status Current Every Day Smoker  . Packs/day: 1.00  . Years: 18.00  . Pack years: 18.00  . Types: Cigarettes  Smokeless Tobacco Never Used     Outpatient Encounter Medications as of 02/10/2018  Medication Sig  . amoxicillin (AMOXIL) 500 MG capsule   . lisinopril (PRINIVIL,ZESTRIL) 10 MG tablet Take 1 tablet (10 mg total) by mouth daily.  . rizatriptan (MAXALT) 5 MG tablet Take 1 tablet (5 mg total) by mouth as needed for migraine. May repeat in 2 hours if needed  . Vitamin D, Ergocalciferol, (DRISDOL) 50000 units CAPS capsule Take 1 capsule (50,000 Units total) by mouth every 7 (seven) days.  . [DISCONTINUED] lisinopril (PRINIVIL,ZESTRIL) 10 MG  tablet Take 1 tablet (10 mg total) by mouth daily.  . [DISCONTINUED] rizatriptan (MAXALT) 5 MG tablet Take 5 mg by mouth as needed for migraine. May repeat in 2 hours if needed  . nicotine (NICODERM CQ - DOSED IN MG/24 HOURS) 14 mg/24hr patch Place 1 patch (14 mg total) onto the skin daily. (Patient not taking: Reported on 02/10/2018)   No facility-administered encounter medications on file as of 02/10/2018.     Allergies: Patient has no known allergies.  Body mass index is 38.27 kg/m.  Blood pressure 126/84, pulse (!) 103, temperature 97.8 F (36.6 C), height 5' 6.26" (1.683 m), weight 239 lb (108.4 kg), SpO2 98 %.    Review of Systems   Constitutional: Positive for fatigue. Negative for activity change, appetite change, chills, diaphoresis, fever and unexpected weight change.  HENT: Negative for congestion.   Eyes: Positive for visual disturbance.  Respiratory: Negative for cough, chest tightness, shortness of breath, wheezing and stridor.   Cardiovascular: Negative for chest pain, palpitations and leg swelling.  Gastrointestinal: Negative for abdominal distention, abdominal pain, blood in stool, constipation, diarrhea, nausea and vomiting.  Endocrine: Negative for cold intolerance, heat intolerance, polydipsia, polyphagia and polyuria.  Genitourinary: Negative for difficulty urinating and flank pain.  Skin: Negative for color change, pallor, rash and wound.  Neurological: Positive for headaches. Negative for dizziness.  Psychiatric/Behavioral: Negative for decreased concentration, dysphoric mood, hallucinations, self-injury, sleep disturbance and suicidal ideas. The patient is not nervous/anxious and is not hyperactive.        Objective:   Physical Exam  Constitutional: She is oriented to person, place, and time. She appears well-developed and well-nourished. No distress.  HENT:  Head: Normocephalic and atraumatic.  Right Ear: Hearing, tympanic membrane, external ear and ear canal normal. Tympanic membrane is not erythematous and not bulging. No decreased hearing is noted.  Left Ear: Hearing, external ear and ear canal normal. Tympanic membrane is not erythematous and not bulging. No decreased hearing is noted.  Nose: No mucosal edema or rhinorrhea. Right sinus exhibits no maxillary sinus tenderness and no frontal sinus tenderness. Left sinus exhibits no maxillary sinus tenderness and no frontal sinus tenderness.  Mouth/Throat: Oropharynx is clear and moist and mucous membranes are normal. Abnormal dentition. No oropharyngeal exudate, posterior oropharyngeal edema, posterior oropharyngeal erythema or tonsillar abscesses.   Eyes: Pupils are equal, round, and reactive to light. Conjunctivae are normal.  Neck: Normal range of motion. Neck supple.  Cardiovascular: Normal rate, regular rhythm, normal heart sounds and intact distal pulses.  No murmur heard. Pulmonary/Chest: Effort normal and breath sounds normal. No respiratory distress. She has no wheezes. She has no rales. She exhibits no tenderness.  Abdominal: Soft. Bowel sounds are normal. She exhibits no distension and no mass. There is no tenderness. There is no rebound and no guarding.  Protuberant abdomen   Musculoskeletal: Normal range of motion.  Lymphadenopathy:    She has no cervical adenopathy.  Neurological: She is alert and oriented to person, place, and time. Coordination normal.  Skin: Skin is warm and dry. No rash noted. She is not diaphoretic. No erythema. No pallor.  Psychiatric: She has a normal mood and affect. Her behavior is normal. Judgment and thought content normal.  Nursing note and vitals reviewed.         Assessment & Plan:   1. HTN, goal below 140/90   2. Healthcare maintenance   3. Tobacco use disorder   4. Vitamin D deficiency   5. Migraine with status  migrainosus, not intractable, unspecified migraine type     Healthcare maintenance Please continue all medications as directed. Increase water intake, strive for at least 110 ounces/day.   Follow Heart Healthy diet Increase regular exercise.  Recommend at least 30 minutes daily, 5 days per week of walking, jogging, biking, swimming, YouTube/Pinterest workout videos. Follow-up in 6 months, sooner if needed.  HTN, goal below 140/90 BP at goal 126/84, HR 103 Continue Lisinopril 10mg  QD Follow heart healthy diet   Migraine with status migrainosus, not intractable Estimates 1-2 migraines/month RF on Rizatriptan provided  Tobacco use disorder Is not ready start nicoderm patch Encouraged to set a "quit date"    FOLLOW-UP:  Return in about 6 months (around  08/13/2018) for Regular Follow Up.

## 2018-02-10 ENCOUNTER — Encounter: Payer: Self-pay | Admitting: Adult Health

## 2018-02-10 ENCOUNTER — Ambulatory Visit (INDEPENDENT_AMBULATORY_CARE_PROVIDER_SITE_OTHER): Payer: No Typology Code available for payment source | Admitting: Adult Health

## 2018-02-10 VITALS — BP 126/84 | HR 103 | Temp 97.8°F | Ht 66.26 in | Wt 239.0 lb

## 2018-02-10 DIAGNOSIS — E559 Vitamin D deficiency, unspecified: Secondary | ICD-10-CM | POA: Diagnosis not present

## 2018-02-10 DIAGNOSIS — Z0000 Encounter for general adult medical examination without abnormal findings: Secondary | ICD-10-CM | POA: Diagnosis not present

## 2018-02-10 DIAGNOSIS — G43901 Migraine, unspecified, not intractable, with status migrainosus: Secondary | ICD-10-CM | POA: Diagnosis not present

## 2018-02-10 DIAGNOSIS — I10 Essential (primary) hypertension: Secondary | ICD-10-CM | POA: Diagnosis not present

## 2018-02-10 DIAGNOSIS — F17200 Nicotine dependence, unspecified, uncomplicated: Secondary | ICD-10-CM | POA: Diagnosis not present

## 2018-02-10 MED ORDER — LISINOPRIL 10 MG PO TABS: 10.0000 mg | ORAL_TABLET | Freq: Every day | ORAL | 2 refills | Status: DC

## 2018-02-10 MED ORDER — RIZATRIPTAN BENZOATE 5 MG PO TABS: 5.0000 mg | ORAL_TABLET | ORAL | 1 refills | Status: DC | PRN

## 2018-02-10 NOTE — Assessment & Plan Note (Signed)
Estimates 1-2 migraines/month RF on Rizatriptan provided

## 2018-02-10 NOTE — Assessment & Plan Note (Signed)
Please continue all medications as directed. Increase water intake, strive for at least 110 ounces/day.   Follow Heart Healthy diet Increase regular exercise.  Recommend at least 30 minutes daily, 5 days per week of walking, jogging, biking, swimming, YouTube/Pinterest workout videos. Follow-up in 6 months, sooner if needed.

## 2018-02-10 NOTE — Patient Instructions (Addendum)
Preventive Care for Adults, Female  A healthy lifestyle and preventive care can promote health and wellness. Preventive health guidelines for women include the following key practices.   A routine yearly physical is a good way to check with your health care provider about your health and preventive screening. It is a chance to share any concerns and updates on your health and to receive a thorough exam.   Visit your dentist for a routine exam and preventive care every 6 months. Brush your teeth twice a day and floss once a day. Good oral hygiene prevents tooth decay and gum disease.   The frequency of eye exams is based on your age, health, family medical history, use of contact lenses, and other factors. Follow your health care provider's recommendations for frequency of eye exams.   Eat a healthy diet. Foods like vegetables, fruits, whole grains, low-fat dairy products, and lean protein foods contain the nutrients you need without too many calories. Decrease your intake of foods high in solid fats, added sugars, and salt. Eat the right amount of calories for you.Get information about a proper diet from your health care provider, if necessary.   Regular physical exercise is one of the most important things you can do for your health. Most adults should get at least 150 minutes of moderate-intensity exercise (any activity that increases your heart rate and causes you to sweat) each week. In addition, most adults need muscle-strengthening exercises on 2 or more days a week.   Maintain a healthy weight. The body mass index (BMI) is a screening tool to identify possible weight problems. It provides an estimate of body fat based on height and weight. Your health care provider can find your BMI, and can help you achieve or maintain a healthy weight.For adults 20 years and older:   - A BMI below 18.5 is considered underweight.   - A BMI of 18.5 to 24.9 is normal.   - A BMI of 25 to 29.9 is  considered overweight.   - A BMI of 30 and above is considered obese.   Maintain normal blood lipids and cholesterol levels by exercising and minimizing your intake of trans and saturated fats.  Eat a balanced diet with plenty of fruit and vegetables. Blood tests for lipids and cholesterol should begin at age 20 and be repeated every 5 years minimum.  If your lipid or cholesterol levels are high, you are over 40, or you are at high risk for heart disease, you may need your cholesterol levels checked more frequently.Ongoing high lipid and cholesterol levels should be treated with medicines if diet and exercise are not working.   If you smoke, find out from your health care provider how to quit. If you do not use tobacco, do not start.   Lung cancer screening is recommended for adults aged 55-80 years who are at high risk for developing lung cancer because of a history of smoking. A yearly low-dose CT scan of the lungs is recommended for people who have at least a 30-pack-year history of smoking and are a current smoker or have quit within the past 15 years. A pack year of smoking is smoking an average of 1 pack of cigarettes a day for 1 year (for example: 1 pack a day for 30 years or 2 packs a day for 15 years). Yearly screening should continue until the smoker has stopped smoking for at least 15 years. Yearly screening should be stopped for people who develop a   health problem that would prevent them from having lung cancer treatment.   If you are pregnant, do not drink alcohol. If you are breastfeeding, be very cautious about drinking alcohol. If you are not pregnant and choose to drink alcohol, do not have more than 1 drink per day. One drink is considered to be 12 ounces (355 mL) of beer, 5 ounces (148 mL) of wine, or 1.5 ounces (44 mL) of liquor.   Avoid use of street drugs. Do not share needles with anyone. Ask for help if you need support or instructions about stopping the use of  drugs.   High blood pressure causes heart disease and increases the risk of stroke. Your blood pressure should be checked at least yearly.  Ongoing high blood pressure should be treated with medicines if weight loss and exercise do not work.   If you are 69-55 years old, ask your health care provider if you should take aspirin to prevent strokes.   Diabetes screening involves taking a blood sample to check your fasting blood sugar level. This should be done once every 3 years, after age 38, if you are within normal weight and without risk factors for diabetes. Testing should be considered at a younger age or be carried out more frequently if you are overweight and have at least 1 risk factor for diabetes.   Breast cancer screening is essential preventive care for women. You should practice "breast self-awareness."  This means understanding the normal appearance and feel of your breasts and may include breast self-examination.  Any changes detected, no matter how small, should be reported to a health care provider.  Women in their 80s and 30s should have a clinical breast exam (CBE) by a health care provider as part of a regular health exam every 1 to 3 years.  After age 66, women should have a CBE every year.  Starting at age 1, women should consider having a mammogram (breast X-ray test) every year.  Women who have a family history of breast cancer should talk to their health care provider about genetic screening.  Women at a high risk of breast cancer should talk to their health care providers about having an MRI and a mammogram every year.   -Breast cancer gene (BRCA)-related cancer risk assessment is recommended for women who have family members with BRCA-related cancers. BRCA-related cancers include breast, ovarian, tubal, and peritoneal cancers. Having family members with these cancers may be associated with an increased risk for harmful changes (mutations) in the breast cancer genes BRCA1 and  BRCA2. Results of the assessment will determine the need for genetic counseling and BRCA1 and BRCA2 testing.   The Pap test is a screening test for cervical cancer. A Pap test can show cell changes on the cervix that might become cervical cancer if left untreated. A Pap test is a procedure in which cells are obtained and examined from the lower end of the uterus (cervix).   - Women should have a Pap test starting at age 57.   - Between ages 90 and 70, Pap tests should be repeated every 2 years.   - Beginning at age 63, you should have a Pap test every 3 years as long as the past 3 Pap tests have been normal.   - Some women have medical problems that increase the chance of getting cervical cancer. Talk to your health care provider about these problems. It is especially important to talk to your health care provider if a  new problem develops soon after your last Pap test. In these cases, your health care provider may recommend more frequent screening and Pap tests.   - The above recommendations are the same for women who have or have not gotten the vaccine for human papillomavirus (HPV).   - If you had a hysterectomy for a problem that was not cancer or a condition that could lead to cancer, then you no longer need Pap tests. Even if you no longer need a Pap test, a regular exam is a good idea to make sure no other problems are starting.   - If you are between ages 36 and 66 years, and you have had normal Pap tests going back 10 years, you no longer need Pap tests. Even if you no longer need a Pap test, a regular exam is a good idea to make sure no other problems are starting.   - If you have had past treatment for cervical cancer or a condition that could lead to cancer, you need Pap tests and screening for cancer for at least 20 years after your treatment.   - If Pap tests have been discontinued, risk factors (such as a new sexual partner) need to be reassessed to determine if screening should  be resumed.   - The HPV test is an additional test that may be used for cervical cancer screening. The HPV test looks for the virus that can cause the cell changes on the cervix. The cells collected during the Pap test can be tested for HPV. The HPV test could be used to screen women aged 70 years and older, and should be used in women of any age who have unclear Pap test results. After the age of 67, women should have HPV testing at the same frequency as a Pap test.   Colorectal cancer can be detected and often prevented. Most routine colorectal cancer screening begins at the age of 57 years and continues through age 26 years. However, your health care provider may recommend screening at an earlier age if you have risk factors for colon cancer. On a yearly basis, your health care provider may provide home test kits to check for hidden blood in the stool.  Use of a small camera at the end of a tube, to directly examine the colon (sigmoidoscopy or colonoscopy), can detect the earliest forms of colorectal cancer. Talk to your health care provider about this at age 23, when routine screening begins. Direct exam of the colon should be repeated every 5 -10 years through age 49 years, unless early forms of pre-cancerous polyps or small growths are found.   People who are at an increased risk for hepatitis B should be screened for this virus. You are considered at high risk for hepatitis B if:  -You were born in a country where hepatitis B occurs often. Talk with your health care provider about which countries are considered high risk.  - Your parents were born in a high-risk country and you have not received a shot to protect against hepatitis B (hepatitis B vaccine).  - You have HIV or AIDS.  - You use needles to inject street drugs.  - You live with, or have sex with, someone who has Hepatitis B.  - You get hemodialysis treatment.  - You take certain medicines for conditions like cancer, organ  transplantation, and autoimmune conditions.   Hepatitis C blood testing is recommended for all people born from [birth year] through 1965 and any individual  with known risks for hepatitis C.   Practice safe sex. Use condoms and avoid high-risk sexual practices to reduce the spread of sexually transmitted infections (STIs). STIs include gonorrhea, chlamydia, syphilis, trichomonas, herpes, HPV, and human immunodeficiency virus (HIV). Herpes, HIV, and HPV are viral illnesses that have no cure. They can result in disability, cancer, and death. Sexually active women aged 25 years and younger should be checked for chlamydia. Older women with new or multiple partners should also be tested for chlamydia. Testing for other STIs is recommended if you are sexually active and at increased risk.   Osteoporosis is a disease in which the bones lose minerals and strength with aging. This can result in serious bone fractures or breaks. The risk of osteoporosis can be identified using a bone density scan. Women ages 65 years and over and women at risk for fractures or osteoporosis should discuss screening with their health care providers. Ask your health care provider whether you should take a calcium supplement or vitamin D to There are also several preventive steps women can take to avoid osteoporosis and resulting fractures or to keep osteoporosis from worsening. -->Recommendations include:  Eat a balanced diet high in fruits, vegetables, calcium, and vitamins.  Get enough calcium. The recommended total intake of is 1,200 mg daily; for best absorption, if taking supplements, divide doses into 250-500 mg doses throughout the day. Of the two types of calcium, calcium carbonate is best absorbed when taken with food but calcium citrate can be taken on an empty stomach.  Get enough vitamin D. NAMS and the National Osteoporosis Foundation recommend at least 1,000 IU per day for women age 50 and over who are at risk of vitamin D  deficiency. Vitamin D deficiency can be caused by inadequate sun exposure (for example, those who live in northern latitudes).  Avoid alcohol and smoking. Heavy alcohol intake (more than 7 drinks per week) increases the risk of falls and hip fracture and women smokers tend to lose bone more rapidly and have lower bone mass than nonsmokers. Stopping smoking is one of the most important changes women can make to improve their health and decrease risk for disease.  Be physically active every day. Weight-bearing exercise (for example, fast walking, hiking, jogging, and weight training) may strengthen bones or slow the rate of bone loss that comes with aging. Balancing and muscle-strengthening exercises can reduce the risk of falling and fracture.  Consider therapeutic medications. Currently, several types of effective drugs are available. Healthcare providers can recommend the type most appropriate for each woman.  Eliminate environmental factors that may contribute to accidents. Falls cause nearly 90% of all osteoporotic fractures, so reducing this risk is an important bone-health strategy. Measures include ample lighting, removing obstructions to walking, using nonskid rugs on floors, and placing mats and/or grab bars in showers.  Be aware of medication side effects. Some common medicines make bones weaker. These include a type of steroid drug called glucocorticoids used for arthritis and asthma, some antiseizure drugs, certain sleeping pills, treatments for endometriosis, and some cancer drugs. An overactive thyroid gland or using too much thyroid hormone for an underactive thyroid can also be a problem. If you are taking these medicines, talk to your doctor about what you can do to help protect your bones.reduce the rate of osteoporosis.    Menopause can be associated with physical symptoms and risks. Hormone replacement therapy is available to decrease symptoms and risks. You should talk to your  health care provider   about whether hormone replacement therapy is right for you.   Use sunscreen. Apply sunscreen liberally and repeatedly throughout the day. You should seek shade when your shadow is shorter than you. Protect yourself by wearing long sleeves, pants, a wide-brimmed hat, and sunglasses year round, whenever you are outdoors.   Once a month, do a whole body skin exam, using a mirror to look at the skin on your back. Tell your health care provider of new moles, moles that have irregular borders, moles that are larger than a pencil eraser, or moles that have changed in shape or color.   -Stay current with required vaccines (immunizations).   Influenza vaccine. All adults should be immunized every year.  Tetanus, diphtheria, and acellular pertussis (Td, Tdap) vaccine. Pregnant women should receive 1 dose of Tdap vaccine during each pregnancy. The dose should be obtained regardless of the length of time since the last dose. Immunization is preferred during the 27th 36th week of gestation. An adult who has not previously received Tdap or who does not know her vaccine status should receive 1 dose of Tdap. This initial dose should be followed by tetanus and diphtheria toxoids (Td) booster doses every 10 years. Adults with an unknown or incomplete history of completing a 3-dose immunization series with Td-containing vaccines should begin or complete a primary immunization series including a Tdap dose. Adults should receive a Td booster every 10 years.  Varicella vaccine. An adult without evidence of immunity to varicella should receive 2 doses or a second dose if she has previously received 1 dose. Pregnant females who do not have evidence of immunity should receive the first dose after pregnancy. This first dose should be obtained before leaving the health care facility. The second dose should be obtained 4 8 weeks after the first dose.  Human papillomavirus (HPV) vaccine. Females aged 13 26  years who have not received the vaccine previously should obtain the 3-dose series. The vaccine is not recommended for use in pregnant females. However, pregnancy testing is not needed before receiving a dose. If a female is found to be pregnant after receiving a dose, no treatment is needed. In that case, the remaining doses should be delayed until after the pregnancy. Immunization is recommended for any person with an immunocompromised condition through the age of 26 years if she did not get any or all doses earlier. During the 3-dose series, the second dose should be obtained 4 8 weeks after the first dose. The third dose should be obtained 24 weeks after the first dose and 16 weeks after the second dose.  Zoster vaccine. One dose is recommended for adults aged 60 years or older unless certain conditions are present.  Measles, mumps, and rubella (MMR) vaccine. Adults born before 1957 generally are considered immune to measles and mumps. Adults born in 1957 or later should have 1 or more doses of MMR vaccine unless there is a contraindication to the vaccine or there is laboratory evidence of immunity to each of the three diseases. A routine second dose of MMR vaccine should be obtained at least 28 days after the first dose for students attending postsecondary schools, health care workers, or international travelers. People who received inactivated measles vaccine or an unknown type of measles vaccine during 1963 1967 should receive 2 doses of MMR vaccine. People who received inactivated mumps vaccine or an unknown type of mumps vaccine before 1979 and are at high risk for mumps infection should consider immunization with 2 doses of   MMR vaccine. For females of childbearing age, rubella immunity should be determined. If there is no evidence of immunity, females who are not pregnant should be vaccinated. If there is no evidence of immunity, females who are pregnant should delay immunization until after pregnancy.  Unvaccinated health care workers born before [birth year] who lack laboratory evidence of measles, mumps, or rubella immunity or laboratory confirmation of disease should consider measles and mumps immunization with 2 doses of MMR vaccine or rubella immunization with 1 dose of MMR vaccine.  Pneumococcal 13-valent conjugate (PCV13) vaccine. When indicated, a person who is uncertain of her immunization history and has no record of immunization should receive the PCV13 vaccine. An adult aged 54 years or older who has certain medical conditions and has not been previously immunized should receive 1 dose of PCV13 vaccine. This PCV13 should be followed with a dose of pneumococcal polysaccharide (PPSV23) vaccine. The PPSV23 vaccine dose should be obtained at least 8 weeks after the dose of PCV13 vaccine. An adult aged 58 years or older who has certain medical conditions and previously received 1 or more doses of PPSV23 vaccine should receive 1 dose of PCV13. The PCV13 vaccine dose should be obtained 1 or more years after the last PPSV23 vaccine dose.  Pneumococcal polysaccharide (PPSV23) vaccine. When PCV13 is also indicated, PCV13 should be obtained first. All adults aged 58 years and older should be immunized. An adult younger than age 65 years who has certain medical conditions should be immunized. Any person who resides in a nursing home or long-term care facility should be immunized. An adult smoker should be immunized. People with an immunocompromised condition and certain other conditions should receive both PCV13 and PPSV23 vaccines. People with human immunodeficiency virus (HIV) infection should be immunized as soon as possible after diagnosis. Immunization during chemotherapy or radiation therapy should be avoided. Routine use of PPSV23 vaccine is not recommended for American Indians, [place] Natives, or people younger than 65 years unless there are medical conditions that require PPSV23 vaccine. When indicated,  people who have unknown immunization and have no record of immunization should receive PPSV23 vaccine. One-time revaccination 5 years after the first dose of PPSV23 is recommended for people aged 70 64 years who have chronic kidney failure, nephrotic syndrome, asplenia, or immunocompromised conditions. People who received 1 2 doses of PPSV23 before age 32 years should receive another dose of PPSV23 vaccine at age 96 years or later if at least 5 years have passed since the previous dose. Doses of PPSV23 are not needed for people immunized with PPSV23 at or after age 55 years.  Meningococcal vaccine. Adults with asplenia or persistent complement component deficiencies should receive 2 doses of quadrivalent meningococcal conjugate (MenACWY-D) vaccine. The doses should be obtained at least 2 months apart. Microbiologists working with certain meningococcal bacteria, [employer] recruits, people at risk during an outbreak, and people who travel to or live in countries with a high rate of meningitis should be immunized. A first-year college student up through age 58 years who is living in a residence hall should receive a dose if she did not receive a dose on or after her 16th birthday. Adults who have certain high-risk conditions should receive one or more doses of vaccine.  Hepatitis A vaccine. Adults who wish to be protected from this disease, have certain high-risk conditions, work with hepatitis A-infected animals, work in hepatitis A research labs, or travel to or work in countries with a high rate of hepatitis A should be  immunized. Adults who were previously unvaccinated and who anticipate close contact with an international adoptee during the first 60 days after arrival in the [place] from a country with a high rate of hepatitis A should be immunized.  Hepatitis B vaccine.  Adults who wish to be protected from this disease, have certain high-risk conditions, may be exposed to blood or other infectious  body fluids, are household contacts or sex partners of hepatitis B positive people, are clients or workers in certain care facilities, or travel to or work in countries with a high rate of hepatitis B should be immunized.  Haemophilus influenzae type b (Hib) vaccine. A previously unvaccinated person with asplenia or sickle cell disease or having a scheduled splenectomy should receive 1 dose of Hib vaccine. Regardless of previous immunization, a recipient of a hematopoietic stem cell transplant should receive a 3-dose series 6 12 months after her successful transplant. Hib vaccine is not recommended for adults with HIV infection.  Preventive Services / Frequency Ages 6 to 39years  Blood pressure check.** / Every 1 to 2 years.  Lipid and cholesterol check.** / Every 5 years beginning at age 39.  Clinical breast exam.** / Every 3 years for women in their 61s and 62s.  BRCA-related cancer risk assessment.** / For women who have family members with a BRCA-related cancer (breast, ovarian, tubal, or peritoneal cancers).  Pap test.** / Every 2 years from ages 47 through 85. Every 3 years starting at age 34 through age 12 or 74 with a history of 3 consecutive normal Pap tests.  HPV screening.** / Every 3 years from ages 46 through ages 43 to 54 with a history of 3 consecutive normal Pap tests.  Hepatitis C blood test.** / For any individual with known risks for hepatitis C.  Skin self-exam. / Monthly.  Influenza vaccine. / Every year.  Tetanus, diphtheria, and acellular pertussis (Tdap, Td) vaccine.** / Consult your health care provider. Pregnant women should receive 1 dose of Tdap vaccine during each pregnancy. 1 dose of Td every 10 years.  Varicella vaccine.** / Consult your health care provider. Pregnant females who do not have evidence of immunity should receive the first dose after pregnancy.  HPV vaccine. / 3 doses over 6 months, if 64 and younger. The vaccine is not recommended for use in  pregnant females. However, pregnancy testing is not needed before receiving a dose.  Measles, mumps, rubella (MMR) vaccine.** / You need at least 1 dose of MMR if you were born in 1957 or later. You may also need a 2nd dose. For females of childbearing age, rubella immunity should be determined. If there is no evidence of immunity, females who are not pregnant should be vaccinated. If there is no evidence of immunity, females who are pregnant should delay immunization until after pregnancy.  Pneumococcal 13-valent conjugate (PCV13) vaccine.** / Consult your health care provider.  Pneumococcal polysaccharide (PPSV23) vaccine.** / 1 to 2 doses if you smoke cigarettes or if you have certain conditions.  Meningococcal vaccine.** / 1 dose if you are age 71 to 37 years and a Market researcher living in a residence hall, or have one of several medical conditions, you need to get vaccinated against meningococcal disease. You may also need additional booster doses.  Hepatitis A vaccine.** / Consult your health care provider.  Hepatitis B vaccine.** / Consult your health care provider.  Haemophilus influenzae type b (Hib) vaccine.** / Consult your health care provider.  Ages 55 to 64years  Blood pressure check.** / Every 1 to 2 years.  Lipid and cholesterol check.** / Every 5 years beginning at age 20 years.  Lung cancer screening. / Every year if you are aged 55 80 years and have a 30-pack-year history of smoking and currently smoke or have quit within the past 15 years. Yearly screening is stopped once you have quit smoking for at least 15 years or develop a health problem that would prevent you from having lung cancer treatment.  Clinical breast exam.** / Every year after age 40 years.  BRCA-related cancer risk assessment.** / For women who have family members with a BRCA-related cancer (breast, ovarian, tubal, or peritoneal cancers).  Mammogram.** / Every year beginning at age 40  years and continuing for as long as you are in good health. Consult with your health care provider.  Pap test.** / Every 3 years starting at age 30 years through age 65 or 70 years with a history of 3 consecutive normal Pap tests.  HPV screening.** / Every 3 years from ages 30 years through ages 65 to 70 years with a history of 3 consecutive normal Pap tests.  Fecal occult blood test (FOBT) of stool. / Every year beginning at age 50 years and continuing until age 75 years. You may not need to do this test if you get a colonoscopy every 10 years.  Flexible sigmoidoscopy or colonoscopy.** / Every 5 years for a flexible sigmoidoscopy or every 10 years for a colonoscopy beginning at age 50 years and continuing until age 75 years.  Hepatitis C blood test.** / For all people born from 1945 through 1965 and any individual with known risks for hepatitis C.  Skin self-exam. / Monthly.  Influenza vaccine. / Every year.  Tetanus, diphtheria, and acellular pertussis (Tdap/Td) vaccine.** / Consult your health care provider. Pregnant women should receive 1 dose of Tdap vaccine during each pregnancy. 1 dose of Td every 10 years.  Varicella vaccine.** / Consult your health care provider. Pregnant females who do not have evidence of immunity should receive the first dose after pregnancy.  Zoster vaccine.** / 1 dose for adults aged 60 years or older.  Measles, mumps, rubella (MMR) vaccine.** / You need at least 1 dose of MMR if you were born in 1957 or later. You may also need a 2nd dose. For females of childbearing age, rubella immunity should be determined. If there is no evidence of immunity, females who are not pregnant should be vaccinated. If there is no evidence of immunity, females who are pregnant should delay immunization until after pregnancy.  Pneumococcal 13-valent conjugate (PCV13) vaccine.** / Consult your health care provider.  Pneumococcal polysaccharide (PPSV23) vaccine.** / 1 to 2 doses if  you smoke cigarettes or if you have certain conditions.  Meningococcal vaccine.** / Consult your health care provider.  Hepatitis A vaccine.** / Consult your health care provider.  Hepatitis B vaccine.** / Consult your health care provider.  Haemophilus influenzae type b (Hib) vaccine.** / Consult your health care provider.  Ages 65 years and over  Blood pressure check.** / Every 1 to 2 years.  Lipid and cholesterol check.** / Every 5 years beginning at age 20 years.  Lung cancer screening. / Every year if you are aged 55 80 years and have a 30-pack-year history of smoking and currently smoke or have quit within the past 15 years. Yearly screening is stopped once you have quit smoking for at least 15 years or develop a health problem that   would prevent you from having lung cancer treatment.  Clinical breast exam.** / Every year after age 103 years.  BRCA-related cancer risk assessment.** / For women who have family members with a BRCA-related cancer (breast, ovarian, tubal, or peritoneal cancers).  Mammogram.** / Every year beginning at age 36 years and continuing for as long as you are in good health. Consult with your health care provider.  Pap test.** / Every 3 years starting at age 5 years through age 85 or 10 years with 3 consecutive normal Pap tests. Testing can be stopped between 65 and 70 years with 3 consecutive normal Pap tests and no abnormal Pap or HPV tests in the past 10 years.  HPV screening.** / Every 3 years from ages 93 years through ages 70 or 45 years with a history of 3 consecutive normal Pap tests. Testing can be stopped between 65 and 70 years with 3 consecutive normal Pap tests and no abnormal Pap or HPV tests in the past 10 years.  Fecal occult blood test (FOBT) of stool. / Every year beginning at age 8 years and continuing until age 45 years. You may not need to do this test if you get a colonoscopy every 10 years.  Flexible sigmoidoscopy or colonoscopy.** /  Every 5 years for a flexible sigmoidoscopy or every 10 years for a colonoscopy beginning at age 69 years and continuing until age 68 years.  Hepatitis C blood test.** / For all people born from [birth year] through 1967 and any individual with known risks for hepatitis C.  Osteoporosis screening.** / A one-time screening for women ages 7 years and over and women at risk for fractures or osteoporosis.  Skin self-exam. / Monthly.  Influenza vaccine. / Every year.  Tetanus, diphtheria, and acellular pertussis (Tdap/Td) vaccine.** / 1 dose of Td every 10 years.  Varicella vaccine.** / Consult your health care provider.  Zoster vaccine.** / 1 dose for adults aged 5 years or older.  Pneumococcal 13-valent conjugate (PCV13) vaccine.** / Consult your health care provider.  Pneumococcal polysaccharide (PPSV23) vaccine.** / 1 dose for all adults aged 74 years and older.  Meningococcal vaccine.** / Consult your health care provider.  Hepatitis A vaccine.** / Consult your health care provider.  Hepatitis B vaccine.** / Consult your health care provider.  Haemophilus influenzae type b (Hib) vaccine.** / Consult your health care provider. ** Family history and personal history of risk and conditions may change your health care provider's recommendations. Document Released: 12/30/2001 Document Revised: 08/24/2013  [facility] Patient Information 2014 [place], [place].   EXERCISE AND DIET:  We recommended that you start or continue a regular exercise program for good health. Regular exercise means any activity that makes your heart beat faster and makes you sweat.  We recommend exercising at least 30 minutes per day at least 3 days a week, preferably 5.  We also recommend a diet low in fat and sugar / carbohydrates.  Inactivity, poor dietary choices and obesity can cause diabetes, heart attack, stroke, and kidney damage, among others.     ALCOHOL AND SMOKING:  Women should limit their alcohol intake to no  more than 7 drinks/beers/glasses of wine (combined, not each!) per week. Moderation of alcohol intake to this level decreases your risk of breast cancer and liver damage.  ( And of course, no recreational drugs are part of a healthy lifestyle.)  Also, you should not be smoking at all or even being exposed to second hand smoke. Most people know smoking can  cause cancer, and various heart and lung diseases, but did you know it also contributes to weakening of your bones?  Aging of your skin?  Yellowing of your teeth and nails?   CALCIUM AND VITAMIN D:  Adequate intake of calcium and Vitamin D are recommended.  The recommendations for exact amounts of these supplements seem to change often, but generally speaking 600 mg of calcium (either carbonate or citrate) and 800 units of Vitamin D per day seems prudent. Certain women may benefit from higher intake of Vitamin D.  If you are among these women, your doctor will have told you during your visit.     PAP SMEARS:  Pap smears, to check for cervical cancer or precancers,  have traditionally been done yearly, although recent scientific advances have shown that most women can have pap smears less often.  However, every woman still should have a physical exam from her gynecologist or primary care physician every year. It will include a breast check, inspection of the vulva and vagina to check for abnormal growths or skin changes, a visual exam of the cervix, and then an exam to evaluate the size and shape of the uterus and ovaries.  And after 39 years of age, a rectal exam is indicated to check for rectal cancers. We will also provide age appropriate advice regarding health maintenance, like when you should have certain vaccines, screening for sexually transmitted diseases, bone density testing, colonoscopy, mammograms, etc.    MAMMOGRAMS:  All women over 65 years old should have a yearly mammogram. Many facilities now offer a "3D" mammogram, which may cost  around $50 extra out of pocket. If possible,  we recommend you accept the option to have the 3D mammogram performed.  It both reduces the number of women who will be called back for extra views which then turn out to be normal, and it is better than the routine mammogram at detecting truly abnormal areas.     COLONOSCOPY:  Colonoscopy to screen for colon cancer is recommended for all women at age 26.  We know, you hate the idea of the prep.  We agree, BUT, having colon cancer and not knowing it is worse!!  Colon cancer so often starts as a polyp that can be seen and removed at colonscopy, which can quite literally save your life!  And if your first colonoscopy is normal and you have no family history of colon cancer, most women don't have to have it again for 10 years.  Once every ten years, you can do something that may end up saving your life, right?  We will be happy to help you get it scheduled when you are ready.  Be sure to check your insurance coverage so you understand how much it will cost.  It may be covered as a preventative service at no cost, but you should check your particular policy.    Mediterranean Diet A Mediterranean diet refers to food and lifestyle choices that are based on the traditions of countries located on the [employer]. This way of eating has been shown to help prevent certain conditions and improve outcomes for people who have chronic diseases, like kidney disease and heart disease. What are tips for following this plan? Lifestyle  Cook and eat meals together with your family, when possible.  Drink enough fluid to keep your urine clear or pale yellow.  Be physically active every day. This includes: ? Aerobic exercise like running or swimming. ? Leisure activities like  gardening, walking, or housework.  Get 7-8 hours of sleep each night.  If recommended by your health care provider, drink red wine in moderation. This means 1 glass a day for nonpregnant women  and 2 glasses a day for men. A glass of wine equals 5 oz (150 mL). Reading food labels  Check the serving size of packaged foods. For foods such as rice and pasta, the serving size refers to the amount of cooked product, not dry.  Check the total fat in packaged foods. Avoid foods that have saturated fat or trans fats.  Check the ingredients list for added sugars, such as corn syrup. Shopping  At the grocery store, buy most of your food from the areas near the walls of the store. This includes: ? Fresh fruits and vegetables (produce). ? Grains, beans, nuts, and seeds. Some of these may be available in unpackaged forms or large amounts (in bulk). ? Fresh seafood. ? Poultry and eggs. ? Low-fat dairy products.  Buy whole ingredients instead of prepackaged foods.  Buy fresh fruits and vegetables in-season from local farmers markets.  Buy frozen fruits and vegetables in resealable bags.  If you do not have access to quality fresh seafood, buy precooked frozen shrimp or canned fish, such as tuna, salmon, or sardines.  Buy small amounts of raw or cooked vegetables, salads, or olives from the deli or salad bar at your store.  Stock your pantry so you always have certain foods on hand, such as olive oil, canned tuna, canned tomatoes, rice, pasta, and beans. Cooking  Cook foods with extra-virgin olive oil instead of using butter or other vegetable oils.  Have meat as a side dish, and have vegetables or grains as your main dish. This means having meat in small portions or adding small amounts of meat to foods like pasta or stew.  Use beans or vegetables instead of meat in common dishes like chili or lasagna.  Experiment with different cooking methods. Try roasting or broiling vegetables instead of steaming or sauteing them.  Add frozen vegetables to soups, stews, pasta, or rice.  Add nuts or seeds for added healthy fat at each meal. You can add these to yogurt, salads, or vegetable  dishes.  Marinate fish or vegetables using olive oil, lemon juice, garlic, and fresh herbs. Meal planning  Plan to eat 1 vegetarian meal one day each week. Try to work up to 2 vegetarian meals, if possible.  Eat seafood 2 or more times a week.  Have healthy snacks readily available, such as: ? Vegetable sticks with hummus. ? [place] yogurt. ? Fruit and nut trail mix.  Eat balanced meals throughout the week. This includes: ? Fruit: 2-3 servings a day ? Vegetables: 4-5 servings a day ? Low-fat dairy: 2 servings a day ? Fish, poultry, or lean meat: 1 serving a day ? Beans and legumes: 2 or more servings a week ? Nuts and seeds: 1-2 servings a day ? Whole grains: 6-8 servings a day ? Extra-virgin olive oil: 3-4 servings a day  Limit red meat and sweets to only a few servings a month What are my food choices?  Mediterranean diet ? Recommended ? Grains: Whole-grain pasta. Brown rice. Bulgar wheat. Polenta. Couscous. Whole-wheat bread. [place]. ? Vegetables: Artichokes. Beets. Broccoli. Cabbage. Carrots. Eggplant. Green beans. Chard. Kale. Spinach. Onions. Leeks. Peas. Squash. Tomatoes. Peppers. Radishes. ? Fruits: Apples. Apricots. Avocado. Berries. Bananas. Cherries. Dates. Figs. Grapes. Lemons. Melon. Oranges. Peaches. Plums. Pomegranate. ? Meats and other protein  foods: Beans. Almonds. Sunflower seeds. Pine nuts. Peanuts. [place]. Salmon. Scallops. Shrimp. [place]. Tilapia. Clams. Oysters. Eggs. ? Dairy: Low-fat milk. Cheese. Greek yogurt. ? Beverages: Water. Red wine. Herbal tea. ? Fats and oils: Extra virgin olive oil. Avocado oil. Grape seed oil. ? Sweets and desserts: [place] yogurt with honey. Baked apples. Poached pears. Trail mix. ? Seasoning and other foods: Basil. Cilantro. Coriander. Cumin. Mint. Parsley. Sage. Rosemary. Tarragon. Garlic. Oregano. Thyme. Pepper. Balsalmic vinegar. Tahini. Hummus. Tomato sauce. Olives. Mushrooms. ? Limit these ? Grains: Prepackaged pasta or  rice dishes. Prepackaged cereal with added sugar. ? Vegetables: Deep fried potatoes (french fries). ? Fruits: Fruit canned in syrup. ? Meats and other protein foods: Beef. Pork. Lamb. Poultry with skin. Hot dogs. [place] Salines. ? Dairy: Ice cream. Sour cream. Whole milk. ? Beverages: Juice. Sugar-sweetened soft drinks. Beer. Liquor and spirits. ? Fats and oils: Butter. Canola oil. Vegetable oil. Beef fat (tallow). Lard. ? Sweets and desserts: Cookies. Cakes. Pies. Candy. ? Seasoning and other foods: Mayonnaise. Premade sauces and marinades. ? The items listed may not be a complete list. Talk with your dietitian about what dietary choices are right for you. Summary  The Mediterranean diet includes both food and lifestyle choices.  Eat a variety of fresh fruits and vegetables, beans, nuts, seeds, and whole grains.  Limit the amount of red meat and sweets that you eat.  Talk with your health care provider about whether it is safe for you to drink red wine in moderation. This means 1 glass a day for nonpregnant women and 2 glasses a day for men. A glass of wine equals 5 oz (150 mL). This information is not intended to replace advice given to you by your health care provider. Make sure you discuss any questions you have with your health care provider. Document Released: 06/26/2016 Document Revised: 07/29/2016 Document Reviewed: 06/26/2016 Elsevier Interactive Patient Education  2018 [employer].   Exercising to [employer] Exercising can help you to lose weight. In order to lose weight through exercise, you need to do vigorous-intensity exercise. You can tell that you are exercising with vigorous intensity if you are breathing very hard and fast and cannot hold a conversation while exercising. Moderate-intensity exercise helps to maintain your current weight. You can tell that you are exercising at a moderate level if you have a higher heart rate and faster breathing, but you are still able to hold a  conversation. How often should I exercise? Choose an activity that you enjoy and set realistic goals. Your health care provider can help you to make an activity plan that works for you. Exercise regularly as directed by your health care provider. This may include:  Doing resistance training twice each week, such as: ? Push-ups. ? Sit-ups. ? Lifting weights. ? Using resistance bands.  Doing a given intensity of exercise for a given amount of time. Choose from these options: ? 150 minutes of moderate-intensity exercise every week. ? 75 minutes of vigorous-intensity exercise every week. ? A mix of moderate-intensity and vigorous-intensity exercise every week.  Children, pregnant women, people who are out of shape, people who are overweight, and older adults may need to consult a health care provider for individual recommendations. If you have any sort of medical condition, be sure to consult your health care provider before starting a new exercise program. What are some activities that can help me to lose weight?  Walking at a rate of at least 4.5 miles an hour.  Jogging or  running at a rate of 5 miles per hour.  Biking at a rate of at least 10 miles per hour.  Lap swimming.  Roller-skating or in-line skating.  Cross-country skiing.  Vigorous competitive sports, such as football, basketball, and soccer.  Jumping rope.  Aerobic dancing. How can I be more active in my day-to-day activities?  Use the stairs instead of the elevator.  Take a walk during your lunch break.  If you drive, park your car farther away from work or school.  If you take public transportation, get off one stop early and walk the rest of the way.  Make all of your phone calls while standing up and walking around.  Get up, stretch, and walk around every 30 minutes throughout the day. What guidelines should I follow while exercising?  Do not exercise so much that you hurt yourself, feel dizzy, or get  very short of breath.  Consult your health care provider prior to starting a new exercise program.  Wear comfortable clothes and shoes with good support.  Drink plenty of water while you exercise to prevent dehydration or heat stroke. Body water is lost during exercise and must be replaced.  Work out until you breathe faster and your heart beats faster. This information is not intended to replace advice given to you by your health care provider. Make sure you discuss any questions you have with your health care provider. Document Released: 12/06/2010 Document Revised: 04/10/2016 Document Reviewed: 04/06/2014 Elsevier Interactive Patient Education  [employer].  Please continue all medications as directed. Increase water intake, strive for at least 110 ounces/day.   Follow Heart Healthy diet Increase regular exercise.  Recommend at least 30 minutes daily, 5 days per week of walking, jogging, biking, swimming, YouTube/Pinterest workout videos. Follow-up in 6 months, sooner if needed. NICE TO SEE YOU!

## 2018-02-10 NOTE — Assessment & Plan Note (Signed)
Is not ready start nicoderm patch Encouraged to set a "quit date"

## 2018-02-10 NOTE — Assessment & Plan Note (Signed)
BP at goal 126/84, HR 103 Continue Lisinopril 10mg  QD Follow heart healthy diet

## 2018-04-29 ENCOUNTER — Emergency Department: Payer: No Typology Code available for payment source

## 2018-04-29 ENCOUNTER — Emergency Department
Admission: EM | Admit: 2018-04-29 | Discharge: 2018-04-29 | Disposition: A | Discharge status: Home / Self Care | Payer: No Typology Code available for payment source | Attending: Emergency Medicine | Admitting: Emergency Medicine

## 2018-04-29 ENCOUNTER — Encounter: Payer: Self-pay | Admitting: Emergency Medicine

## 2018-04-29 ENCOUNTER — Other Ambulatory Visit: Payer: Self-pay

## 2018-04-29 DIAGNOSIS — Z79899 Other long term (current) drug therapy: Secondary | ICD-10-CM | POA: Insufficient documentation

## 2018-04-29 DIAGNOSIS — R1011 Right upper quadrant pain: Secondary | ICD-10-CM | POA: Insufficient documentation

## 2018-04-29 DIAGNOSIS — F17210 Nicotine dependence, cigarettes, uncomplicated: Secondary | ICD-10-CM | POA: Diagnosis not present

## 2018-04-29 DIAGNOSIS — R109 Unspecified abdominal pain: Secondary | ICD-10-CM

## 2018-04-29 DIAGNOSIS — R197 Diarrhea, unspecified: Secondary | ICD-10-CM | POA: Diagnosis not present

## 2018-04-29 DIAGNOSIS — I10 Essential (primary) hypertension: Secondary | ICD-10-CM | POA: Diagnosis not present

## 2018-04-29 HISTORY — DX: Essential (primary) hypertension: I10

## 2018-04-29 LAB — COMPREHENSIVE METABOLIC PANEL
ALK PHOS: 73 U/L (ref 38–126)
ALT: 21 U/L (ref 14–54)
ANION GAP: 10 (ref 5–15)
AST: 23 U/L (ref 15–41)
Albumin: 4.1 g/dL (ref 3.5–5.0)
BILIRUBIN TOTAL: 0.5 mg/dL (ref 0.3–1.2)
BUN: 7 mg/dL (ref 6–20)
CALCIUM: 9.1 mg/dL (ref 8.9–10.3)
CO2: 21 mmol/L — AB (ref 22–32)
CREATININE: 0.57 mg/dL (ref 0.44–1.00)
Chloride: 104 mmol/L (ref 101–111)
GFR calc non Af Amer: >60 mL/min (ref >60)
GLUCOSE: 115 mg/dL — AB (ref 65–99)
Potassium: 3.5 mmol/L (ref 3.5–5.1)
SODIUM: 135 mmol/L (ref 135–145)
Total Protein: 7.7 g/dL (ref 6.5–8.1)

## 2018-04-29 LAB — CBC
HCT: 41.3 % (ref 35.0–47.0)
HEMOGLOBIN: 14.1 g/dL (ref 12.0–16.0)
MCH: 30.4 pg (ref 26.0–34.0)
MCHC: 34.1 g/dL (ref 32.0–36.0)
MCV: 89.2 fL (ref 80.0–100.0)
PLATELETS: 353 10*3/uL (ref 150–440)
RBC: 4.63 MIL/uL (ref 3.80–5.20)
RDW: 14.1 % (ref 11.5–14.5)
WBC: 13.1 10*3/uL — ABNORMAL HIGH (ref 3.6–11.0)

## 2018-04-29 LAB — URINALYSIS, COMPLETE (UACMP) WITH MICROSCOPIC
Bilirubin Urine: NEGATIVE
GLUCOSE, UA: NEGATIVE mg/dL
HGB URINE DIPSTICK: NEGATIVE
Ketones, ur: 5 mg/dL — AB
Leukocytes, UA: NEGATIVE
NITRITE: NEGATIVE
PH: 5 (ref 5.0–8.0)
Protein, ur: NEGATIVE mg/dL
Specific Gravity, Urine: 1.021 (ref 1.005–1.030)

## 2018-04-29 LAB — HCG, QUANTITATIVE, PREGNANCY: hCG, Beta Chain, Quant, S: <1 m[IU]/mL (ref <5)

## 2018-04-29 LAB — LIPASE, BLOOD: Lipase: 22 U/L (ref 11–51)

## 2018-04-29 MED ORDER — KETOROLAC TROMETHAMINE 30 MG/ML IJ SOLN
15.0000 mg | Freq: Once | INTRAMUSCULAR | Status: AC
  Administered 2018-04-29: 15 mg via INTRAVENOUS
  Filled 2018-04-29: qty 1

## 2018-04-29 MED ORDER — MORPHINE SULFATE (PF) 4 MG/ML IV SOLN
4.0000 mg | Freq: Once | INTRAVENOUS | Status: AC
  Administered 2018-04-29: 4 mg via INTRAVENOUS
  Filled 2018-04-29: qty 1

## 2018-04-29 MED ORDER — ONDANSETRON HCL 4 MG/2ML IJ SOLN
4.0000 mg | Freq: Once | INTRAMUSCULAR | Status: AC
Start: 2018-04-29 — End: 2018-04-29
  Administered 2018-04-29: 4 mg via INTRAVENOUS
  Filled 2018-04-29: qty 2

## 2018-04-29 NOTE — Discharge Instructions (Addendum)

## 2018-04-29 NOTE — ED Notes (Signed)
Pt c/o upper abd pain that radiates around to the back and right flank  Since yesterday with diarrhea, pt states she can not get comfortable. Denies N/V.Sylvia Warren. Denies hx of kidney stones or diverticulitis..Sylvia Warren

## 2018-04-29 NOTE — ED Triage Notes (Addendum)
[facility]/ mid abd pain radiating into flank pain since this AM. VSS, denies n/v .

## 2018-04-29 NOTE — ED Provider Notes (Signed)
[facility] Emergency Department Provider Note  ____________________________________________  Time seen: Approximately 5:01 PM  I have reviewed the triage vital signs and the nursing notes.   HISTORY  Chief Complaint Abdominal Pain   HPI Sylvia Warren is a 37 y.o. female with a history of hypertension and tubal ligation who presents for evaluation of abdominal pain.  Patient reports that she woke up this morning with a pressure-like pain in the right upper quadrant radiating to the epigastric region and her back.  Throughout the day the pain became constant with intermittent sharp component mostly on the right flank.  She reports 4-5 daily episodes of watery diarrhea for the last 4 days. She has had no nausea, no vomiting, no fever, no chills, no constipation, no shortness of breath, no URI symptoms and no chest pain.  No prior history of gallstones or kidney stones.  Patient has had a tubal ligation but no other abdominal surgeries.  Has been having normal bowel movements.  The pain is 7 out of 10 at this time. LMP last week.  Patient denies pleuritic pain, personal or family history of blood clots, recent travel immobilization, leg pain or swelling, exogenous hormones, or history of cancer.  Past Medical History:  Diagnosis Date  . Hypertension     Patient Active Problem List   Diagnosis Date Noted  . Vitamin D deficiency 02/10/2018  . Migraine with status migrainosus, not intractable 02/10/2018  . Elevated blood pressure reading 12/16/2017  . HTN, goal below 140/90 12/16/2017  . Tobacco use disorder 12/16/2017  . Healthcare maintenance 12/16/2017    Past Surgical History:  Procedure Laterality Date  . CESAREAN SECTION    . KNEE ARTHROSCOPY    . TUBAL LIGATION      Prior to Admission medications   Medication Sig Start Date End Date Taking? Authorizing Provider  amoxicillin (AMOXIL) 500 MG capsule  01/26/18   [provider]    lisinopril (PRINIVIL,ZESTRIL) 10 MG tablet Take 1 tablet (10 mg total) by mouth daily. 02/10/18   Danford, Orpha Bur D, NP  nicotine (NICODERM CQ - DOSED IN MG/24 HOURS) 14 mg/24hr patch Place 1 patch (14 mg total) onto the skin daily. Patient not taking: Reported on 02/10/2018 12/16/17   William Hamburger D, NP  rizatriptan (MAXALT) 5 MG tablet Take 1 tablet (5 mg total) by mouth as needed for migraine. May repeat in 2 hours if needed 02/10/18   William Hamburger D, NP  Vitamin D, Ergocalciferol, (DRISDOL) 50000 units CAPS capsule Take 1 capsule (50,000 Units total) by mouth every 7 (seven) days. 01/08/18   Julaine Fusi, NP    Allergies Patient has no known allergies.  Family History  Problem Relation Age of Onset  . Hypertension Mother   . Cirrhosis Mother   . Kidney disease Mother   . Healthy Sister   . Healthy Daughter   . Healthy Son   . Cancer Maternal Aunt        vulvular  . Diabetes Paternal Grandmother   . Healthy Sister   . Healthy Sister   . Healthy Son     Social History Social History   Tobacco Use  . Smoking status: Current Every Day Smoker    Packs/day: 1.00    Years: 18.00    Pack years: 18.00    Types: Cigarettes  . Smokeless tobacco: Never Used  Substance Use Topics  . Alcohol use: No    Frequency: Never  . Drug use: No  Review of Systems  Constitutional: Negative for fever. Eyes: Negative for visual changes. ENT: Negative for sore throat. Neck: No neck pain  Cardiovascular: Negative for chest pain. Respiratory: Negative for shortness of breath. Gastrointestinal: + RUQ abdominal pain and diarrhea. No vomiting  Genitourinary: Negative for dysuria. Musculoskeletal: Negative for back pain. Skin: Negative for rash. Neurological: Negative for headaches, weakness or numbness. Psych: No SI or HI  ____________________________________________   PHYSICAL EXAM:  VITAL SIGNS: ED Triage Vitals  Enc Vitals Group     BP 04/29/18 1620 122/71     Pulse Rate  04/29/18 1620 93     Resp 04/29/18 1620 18     Temp 04/29/18 1620 99.6 F (37.6 C)     Temp Source 04/29/18 1620 Oral     SpO2 04/29/18 1620 95 %     Weight 04/29/18 1621 240 lb (108.9 kg)     Height 04/29/18 1621 5\' 7"  (1.702 m)     Head Circumference --      Peak Flow --      Pain Score 04/29/18 1622 7     Pain Loc --      Pain Edu? --      Excl. in GC? --     Constitutional: Alert and oriented. Well appearing and in no apparent distress. HEENT:      Head: Normocephalic and atraumatic.         Eyes: Conjunctivae are normal. Sclera is non-icteric.       Mouth/Throat: Mucous membranes are moist.       Neck: Supple with no signs of meningismus. Cardiovascular: Regular rate and rhythm. No murmurs, gallops, or rubs. 2+ symmetrical distal pulses are present in all extremities. No JVD. Respiratory: Normal respiratory effort. Lungs are clear to auscultation bilaterally. No wheezes, crackles, or rhonchi.  Gastrointestinal: Soft, ttp over the RUQ, negative Murphy's sign, and non distended with positive bowel sounds. No rebound or guarding. Genitourinary: R CVA tenderness. Musculoskeletal: Nontender with normal range of motion in all extremities. No edema, cyanosis, or erythema of extremities. Neurologic: Normal speech and language. Face is symmetric. Moving all extremities. No gross focal neurologic deficits are appreciated. Skin: Skin is warm, dry and intact. No rash noted. Psychiatric: Mood and affect are normal. Speech and behavior are normal.  ____________________________________________   LABS (all labs ordered are listed, but only abnormal results are displayed)  Labs Reviewed  COMPREHENSIVE METABOLIC PANEL - Abnormal; Notable for the following components:      Result Value   CO2 21 (*)    Glucose, Bld 115 (*)    All other components within normal limits  CBC - Abnormal; Notable for the following components:   WBC 13.1 (*)    All other components within normal limits    URINALYSIS, COMPLETE (UACMP) WITH MICROSCOPIC - Abnormal; Notable for the following components:   Color, Urine YELLOW (*)    APPearance HAZY (*)    Ketones, ur 5 (*)    Bacteria, UA RARE (*)    All other components within normal limits  LIPASE, BLOOD  HCG, QUANTITATIVE, PREGNANCY   ____________________________________________  EKG  none  ____________________________________________  RADIOLOGY  I have personally reviewed the images performed during this visit and I agree with the Radiologist's read.   Interpretation by Radiologist:  Ct Renal Stone Study  Result Date: 04/29/2018 CLINICAL DATA:  Upper abdominal pain radiating to back and right flank since yesterday with diarrhea. EXAM: CT ABDOMEN AND PELVIS WITHOUT CONTRAST TECHNIQUE: Multidetector CT imaging  of the abdomen and pelvis was performed following the standard protocol without IV contrast. COMPARISON:  None. FINDINGS: Lower chest: Lung bases are within normal. Hepatobiliary: Normal. Pancreas: Normal. Spleen: Normal. Adrenals/Urinary Tract: Adrenal glands are normal. Kidneys are normal in size without hydronephrosis. No evidence of renal stones. Ureters and bladder are normal. Stomach/Bowel: Stomach and small bowel are normal. The appendix is normal. Minimal diverticulosis of the colon. Vascular/Lymphatic: Normal. Reproductive: Normal. Other: No free fluid or focal inflammatory change. Musculoskeletal: Mild degenerative change of the spine. IMPRESSION: No acute findings in the abdomen/pelvis. Minimal diverticulosis of the colon. Electronically Signed   By: Elberta Fortis M.D.   On: 04/29/2018 18:08   US Abdomen Limited Ruq  Result Date: 04/29/2018 CLINICAL DATA:  Right abdominal pain beginning today. EXAM: ULTRASOUND ABDOMEN LIMITED RIGHT UPPER QUADRANT COMPARISON:  CT earlier same day. FINDINGS: Gallbladder: No gallstones or wall thickening visualized. No sonographic Murphy sign noted by sonographer. Common bile duct: Diameter: 2  mm common normal Liver: Slightly echogenic liver suggesting mild fatty change. No focal lesion or ductal dilatation. Portal vein is patent on color Doppler imaging with normal direction of blood flow towards the liver. IMPRESSION: No acute finding. No evidence of gallstones or biliary obstruction. Possible mild fatty change of the liver. Electronically Signed   By: Paulina Fusi M.D.   On: 04/29/2018 18:50      ____________________________________________   PROCEDURES  Procedure(s) performed: None Procedures Critical Care performed:  None ____________________________________________   INITIAL IMPRESSION / ASSESSMENT AND PLAN / ED COURSE  37 y.o. female with a history of hypertension and tubal ligation who presents for evaluation of RUQ abdominal pain/ R flank pain since this am. No associated symptoms.  Patient is well-appearing, no distress, has a temp of 99.6, remaining her vital signs are within normal limits, abdomen is soft with tenderness to palpation on the right flank and right upper quadrant with negative Murphy sign.  Differential diagnosis includes kidney stone versus pyelonephritis versus gallbladder pathology versus infectious diarrhea vs pancreatitis versus diverticulitis vs colitis.  Plan for CBC, CMP, lipase, urinalysis.  After labs will determine which imaging is the most appropriate.  Will treat pain.    _________________________ 6:58 PM on 04/29/2018 -----------------------------------------  CT abdomen pelvis, and right upper quadrant ultrasound with no acute findings.  Labs show leukocytosis with white count of 13.1.  Presentation most likely concerning for infectious diarrhea.  We will send patient home with supportive care and close follow-up with primary care doctor.  Discussed return precautions .   As part of my medical decision making, I reviewed the following data within the electronic MEDICAL RECORD NUMBER Nursing notes reviewed and incorporated, Labs reviewed , Old  chart reviewed, Radiograph reviewed , Notes from prior ED visits and [place] Controlled Substance Database    Pertinent labs & imaging results that were available during my care of the patient were reviewed by me and considered in my medical decision making (see chart for details).    ____________________________________________   FINAL CLINICAL IMPRESSION(S) / ED DIAGNOSES  Final diagnoses:  RUQ abdominal pain  Abdominal pain, unspecified abdominal location  Diarrhea of presumed infectious origin      NEW MEDICATIONS STARTED DURING THIS VISIT:  ED Discharge Orders    None       Note:  This document was prepared using Dragon voice recognition software and may include unintentional dictation errors.    Nita Sickle, MD 04/29/18 [phone]

## 2018-04-30 ENCOUNTER — Emergency Department
Admission: EM | Admit: 2018-04-30 | Discharge: 2018-04-30 | Disposition: A | Discharge status: Home / Self Care | Payer: No Typology Code available for payment source | Attending: Emergency Medicine | Admitting: Emergency Medicine

## 2018-04-30 ENCOUNTER — Encounter: Payer: Self-pay | Admitting: Emergency Medicine

## 2018-04-30 ENCOUNTER — Emergency Department: Payer: No Typology Code available for payment source

## 2018-04-30 ENCOUNTER — Other Ambulatory Visit: Payer: Self-pay

## 2018-04-30 DIAGNOSIS — Z79899 Other long term (current) drug therapy: Secondary | ICD-10-CM | POA: Insufficient documentation

## 2018-04-30 DIAGNOSIS — R1031 Right lower quadrant pain: Secondary | ICD-10-CM | POA: Insufficient documentation

## 2018-04-30 DIAGNOSIS — I10 Essential (primary) hypertension: Secondary | ICD-10-CM | POA: Insufficient documentation

## 2018-04-30 DIAGNOSIS — F17210 Nicotine dependence, cigarettes, uncomplicated: Secondary | ICD-10-CM | POA: Diagnosis not present

## 2018-04-30 LAB — URINALYSIS, COMPLETE (UACMP) WITH MICROSCOPIC
Bacteria, UA: NONE SEEN
Bilirubin Urine: NEGATIVE
Glucose, UA: NEGATIVE mg/dL
Ketones, ur: NEGATIVE mg/dL
LEUKOCYTES UA: NEGATIVE
Nitrite: NEGATIVE
PH: 5 (ref 5.0–8.0)
Protein, ur: NEGATIVE mg/dL
SPECIFIC GRAVITY, URINE: 1.016 (ref 1.005–1.030)

## 2018-04-30 LAB — COMPREHENSIVE METABOLIC PANEL
ALT: 17 U/L (ref 14–54)
AST: 17 U/L (ref 15–41)
Albumin: 3.9 g/dL (ref 3.5–5.0)
Alkaline Phosphatase: 70 U/L (ref 38–126)
Anion gap: 9 (ref 5–15)
BILIRUBIN TOTAL: 0.7 mg/dL (ref 0.3–1.2)
BUN: 8 mg/dL (ref 6–20)
CO2: 23 mmol/L (ref 22–32)
Calcium: 8.8 mg/dL — ABNORMAL LOW (ref 8.9–10.3)
Chloride: 103 mmol/L (ref 101–111)
Creatinine, Ser: 0.65 mg/dL (ref 0.44–1.00)
GFR calc Af Amer: >60 mL/min (ref >60)
Glucose, Bld: 104 mg/dL — ABNORMAL HIGH (ref 65–99)
POTASSIUM: 3.8 mmol/L (ref 3.5–5.1)
Sodium: 135 mmol/L (ref 135–145)
TOTAL PROTEIN: 7.5 g/dL (ref 6.5–8.1)

## 2018-04-30 LAB — CBC WITH DIFFERENTIAL/PLATELET
Basophils Absolute: 0.1 10*3/uL (ref 0–0.1)
Basophils Relative: 1 %
Eosinophils Absolute: 0.2 10*3/uL (ref 0–0.7)
Eosinophils Relative: 2 %
HEMATOCRIT: 40.6 % (ref 35.0–47.0)
Hemoglobin: 13.9 g/dL (ref 12.0–16.0)
LYMPHS ABS: 2.8 10*3/uL (ref 1.0–3.6)
LYMPHS PCT: 21 %
MCH: 30.2 pg (ref 26.0–34.0)
MCHC: 34.1 g/dL (ref 32.0–36.0)
MCV: 88.5 fL (ref 80.0–100.0)
MONOS PCT: 10 %
Monocytes Absolute: 1.3 10*3/uL — ABNORMAL HIGH (ref 0.2–0.9)
NEUTROS ABS: 8.8 10*3/uL — AB (ref 1.4–6.5)
Neutrophils Relative %: 66 %
Platelets: 332 10*3/uL (ref 150–440)
RBC: 4.59 MIL/uL (ref 3.80–5.20)
RDW: 14 % (ref 11.5–14.5)
WBC: 13.3 10*3/uL — ABNORMAL HIGH (ref 3.6–11.0)

## 2018-04-30 LAB — LIPASE, BLOOD: LIPASE: 21 U/L (ref 11–51)

## 2018-04-30 MED ORDER — FAMOTIDINE 20 MG PO TABS: 20.0000 mg | ORAL_TABLET | Freq: Two times a day (BID) | ORAL | 0 refills | Status: DC

## 2018-04-30 MED ORDER — ACETAMINOPHEN 325 MG PO TABS: 650.0000 mg | ORAL_TABLET | Freq: Four times a day (QID) | ORAL | 0 refills | Status: DC | PRN

## 2018-04-30 MED ORDER — ALUMINUM-MAGNESIUM-SIMETHICONE 200-200-20 MG/5ML PO SUSP: 30.0000 mL | Freq: Three times a day (TID) | ORAL | 0 refills | Status: DC

## 2018-04-30 MED ORDER — IOPAMIDOL (ISOVUE-300) INJECTION 61%
100.0000 mL | Freq: Once | INTRAVENOUS | Status: AC | PRN
  Administered 2018-04-30: 100 mL via INTRAVENOUS

## 2018-04-30 MED ORDER — ONDANSETRON HCL 4 MG/2ML IJ SOLN
4.0000 mg | Freq: Once | INTRAMUSCULAR | Status: AC
  Administered 2018-04-30: 4 mg via INTRAVENOUS
  Filled 2018-04-30: qty 2

## 2018-04-30 MED ORDER — KETOROLAC TROMETHAMINE 30 MG/ML IJ SOLN
15.0000 mg | INTRAMUSCULAR | Status: AC
  Administered 2018-04-30: 15 mg via INTRAVENOUS
  Filled 2018-04-30: qty 1

## 2018-04-30 MED ORDER — ONDANSETRON 4 MG PO TBDP: 4.0000 mg | ORAL_TABLET | Freq: Three times a day (TID) | ORAL | 0 refills | Status: DC | PRN

## 2018-04-30 MED ORDER — IOPAMIDOL (ISOVUE-300) INJECTION 61%
30.0000 mL | Freq: Once | INTRAVENOUS | Status: AC | PRN
  Administered 2018-04-30: 30 mL via ORAL

## 2018-04-30 MED ORDER — SODIUM CHLORIDE 0.9 % IV BOLUS
1000.0000 mL | Freq: Once | INTRAVENOUS | Status: AC
  Administered 2018-04-30: 1000 mL via INTRAVENOUS

## 2018-04-30 NOTE — ED Notes (Signed)
Pt states seen yesterday, reports back today because pain is worse. Pt reports pain to R flank and RLQ at this time. Pt states has not taken any medication since leaving for pain.

## 2018-04-30 NOTE — ED Notes (Signed)
NAD noted at time of D/C. Pt denies questions or concerns. Pt ambulatory to the lobby at this time.  

## 2018-04-30 NOTE — ED Notes (Signed)
First Nurse Note:  Patient ambulatory to room 6, bed in low position, given gown to put on.  Megan RN aware of room placement.

## 2018-04-30 NOTE — ED Triage Notes (Signed)
Patient ambulatory to triage with steady gait, without difficulty or distress noted; pt reports right lower abd/back pain, seen earlier for same and told tests WNL

## 2018-04-30 NOTE — ED Provider Notes (Signed)
[facility] Emergency Department Provider Note  ____________________________________________  Time seen: Approximately 12:53 PM  I have reviewed the triage vital signs and the nursing notes.   HISTORY  Chief Complaint Abdominal Pain    HPI Sylvia Warren is a 37 y.o. female with a history of hypertension who complains of right lower quadrant abdominal pain  for 2 days.  Pain is constant, waxing and waning, no aggravating or alleviating factors.  Radiates to right lower back.  Moderate intensity.  Sharp.  Was seen in the ED yesterday for similar complaints.  Had a negative noncontrast CT scan of the abdomen as well as a negative ultrasound of the right upper quadrant.  No fever or chills.  No vomiting or diarrhea or constipation.  She returns for reassessment due to the ongoing pain.  She reports that she has not tried anything at home to control the symptoms so far.     Past Medical History:  Diagnosis Date  . Hypertension      Patient Active Problem List   Diagnosis Date Noted  . Vitamin D deficiency 02/10/2018  . Migraine with status migrainosus, not intractable 02/10/2018  . Elevated blood pressure reading 12/16/2017  . HTN, goal below 140/90 12/16/2017  . Tobacco use disorder 12/16/2017  . Healthcare maintenance 12/16/2017     Past Surgical History:  Procedure Laterality Date  . CESAREAN SECTION    . KNEE ARTHROSCOPY    . TUBAL LIGATION       Prior to Admission medications   Medication Sig Start Date End Date Taking? Authorizing Provider  acetaminophen (TYLENOL) 325 MG tablet Take 2 tablets (650 mg total) by mouth every 6 (six) hours as needed. 04/30/18   Sharman Cheek, MD  aluminum-magnesium hydroxide-simethicone (MAALOX) 200-200-20 MG/5ML SUSP Take 30 mLs by mouth 4 (four) times daily -  before meals and at bedtime. 04/30/18   Sharman Cheek, MD  amoxicillin (AMOXIL) 500 MG capsule  01/26/18   [provider]   famotidine (PEPCID) 20 MG tablet Take 1 tablet (20 mg total) by mouth 2 (two) times daily. 04/30/18   Sharman Cheek, MD  lisinopril (PRINIVIL,ZESTRIL) 10 MG tablet Take 1 tablet (10 mg total) by mouth daily. 02/10/18   Danford, Orpha Bur D, NP  nicotine (NICODERM CQ - DOSED IN MG/24 HOURS) 14 mg/24hr patch Place 1 patch (14 mg total) onto the skin daily. Patient not taking: Reported on 02/10/2018 12/16/17   William Hamburger D, NP  ondansetron (ZOFRAN ODT) 4 MG disintegrating tablet Take 1 tablet (4 mg total) by mouth every 8 (eight) hours as needed for nausea or vomiting. 04/30/18   Sharman Cheek, MD  rizatriptan (MAXALT) 5 MG tablet Take 1 tablet (5 mg total) by mouth as needed for migraine. May repeat in 2 hours if needed 02/10/18   William Hamburger D, NP  Vitamin D, Ergocalciferol, (DRISDOL) 50000 units CAPS capsule Take 1 capsule (50,000 Units total) by mouth every 7 (seven) days. 01/08/18   Julaine Fusi, NP     Allergies Patient has no known allergies.   Family History  Problem Relation Age of Onset  . Hypertension Mother   . Cirrhosis Mother   . Kidney disease Mother   . Healthy Sister   . Healthy Daughter   . Healthy Son   . Cancer Maternal Aunt        vulvular  . Diabetes Paternal Grandmother   . Healthy Sister   . Healthy Sister   . Healthy Son  Social History Social History   Tobacco Use  . Smoking status: Current Every Day Smoker    Packs/day: 1.00    Years: 18.00    Pack years: 18.00    Types: Cigarettes  . Smokeless tobacco: Never Used  Substance Use Topics  . Alcohol use: No    Frequency: Never  . Drug use: No    Review of Systems  Constitutional:   No fever or chills.  ENT:   No sore throat. No rhinorrhea. Cardiovascular:   No chest pain or syncope. Respiratory:   No dyspnea or cough. Gastrointestinal: Positive as above for abdominal pain without vomiting and diarrhea.  Musculoskeletal:   Negative for focal pain or swelling All other systems reviewed  and are negative except as documented above in ROS and HPI.  ____________________________________________   PHYSICAL EXAM:  VITAL SIGNS: ED Triage Vitals  Enc Vitals Group     BP 04/30/18 0756 (!) 143/87     Pulse Rate 04/30/18 0756 87     Resp 04/30/18 0756 18     Temp 04/30/18 0756 98.2 F (36.8 C)     Temp Source 04/30/18 0756 Oral     SpO2 04/30/18 0756 97 %     Weight 04/30/18 0455 240 lb (108.9 kg)     Height 04/30/18 0455 5\' 7"  (1.702 m)     Head Circumference --      Peak Flow --      Pain Score 04/30/18 0455 9     Pain Loc --      Pain Edu? --      Excl. in GC? --     Vital signs reviewed, nursing assessments reviewed.   Constitutional:   Alert and oriented. Non-toxic appearance. Eyes:   Conjunctivae are normal. EOMI. PERRL. ENT      Head:   Normocephalic and atraumatic.      Nose:   No congestion/rhinnorhea.       Mouth/Throat:   MMM, no pharyngeal erythema. No peritonsillar mass.       Neck:   No meningismus. Full ROM. Hematological/Lymphatic/Immunilogical:   No cervical lymphadenopathy. Cardiovascular:   RRR. Symmetric bilateral radial and DP pulses.  No murmurs.  Respiratory:   Normal respiratory effort without tachypnea/retractions. Breath sounds are clear and equal bilaterally. No wheezes/rales/rhonchi. Gastrointestinal:   Soft with right lower quadrant tenderness. Non distended. There is no CVA tenderness.  No rebound, rigidity, or guarding.  Musculoskeletal:   Normal range of motion in all extremities. No joint effusions.  No lower extremity tenderness.  No edema. Neurologic:   Normal speech and language.  Motor grossly intact. No acute focal neurologic deficits are appreciated.  Skin:    Skin is warm, dry and intact. No rash noted.  No petechiae, purpura, or bullae.  ____________________________________________    LABS (pertinent positives/negatives) (all labs ordered are listed, but only abnormal results are displayed) Labs Reviewed  CBC WITH  DIFFERENTIAL/PLATELET - Abnormal; Notable for the following components:      Result Value   WBC 13.3 (*)    Neutro Abs 8.8 (*)    Monocytes Absolute 1.3 (*)    All other components within normal limits  COMPREHENSIVE METABOLIC PANEL - Abnormal; Notable for the following components:   Glucose, Bld 104 (*)    Calcium 8.8 (*)    All other components within normal limits  URINALYSIS, COMPLETE (UACMP) WITH MICROSCOPIC - Abnormal; Notable for the following components:   Color, Urine YELLOW (*)    APPearance  HAZY (*)    Hgb urine dipstick SMALL (*)    All other components within normal limits  LIPASE, BLOOD   ____________________________________________   EKG    ____________________________________________    RADIOLOGY  Ct Abdomen Pelvis W Contrast  Result Date: 04/30/2018 CLINICAL DATA:  Worsened RIGHT flank and RIGHT lower quadrant pain since 04/29/2018 EXAM: CT ABDOMEN AND PELVIS WITH CONTRAST TECHNIQUE: Multidetector CT imaging of the abdomen and pelvis was performed using the standard protocol following bolus administration of intravenous contrast. Sagittal and coronal MPR images reconstructed from axial data set. CONTRAST:  100mL ISOVUE-300 IOPAMIDOL (ISOVUE-300) INJECTION 61% IV. Dilute oral contrast. COMPARISON:  04/29/2018 FINDINGS: Lower chest: Minimal dependent bibasilar atelectasis. Tiny RIGHT pleural effusion. Hepatobiliary: 8 mm nonspecific poorly defined low-attenuation focus lateral aspect inferior RIGHT lobe liver image 29. Gallbladder and liver otherwise normal appearance. No biliary dilatation. Pancreas: Normal appearance Spleen: Normal appearance Adrenals/Urinary Tract: Adrenal glands, kidneys, ureters, and bladder normal appearance Stomach/Bowel: Normal appendix. Question minimal sigmoid diverticulosis. Stomach and bowel loops otherwise normal appearance. Vascular/Lymphatic: Vascular structures unremarkable. No adenopathy. Reproductive: Dominant follicle RIGHT ovary.  Uterus and ovaries otherwise normal appearance. Other: No free air or free fluid. No hernia or acute inflammatory process. Musculoskeletal: Degenerative disc disease changes at L5-S1 with bulging disc and endplate spurring. Associated AP narrowing of spinal canal and LEFT lateral recess stenosis at L5-S1. Probable BILATERAL neural foraminal stenosis at L5-S1. IMPRESSION: Spinal stenosis, BILATERAL neural foraminal stenosis and probable LEFT lateral recess stenosis at L5-S1. 8 mm nonspecific low-attenuation focus within the liver. Bibasilar atelectasis and minimal RIGHT pleural effusion. Otherwise negative exam. Electronically Signed   By: Ulyses SouthwardMark  Boles M.D.   On: 04/30/2018 10:36   Ct Renal Stone Study  Result Date: 04/29/2018 CLINICAL DATA:  Upper abdominal pain radiating to back and right flank since yesterday with diarrhea. EXAM: CT ABDOMEN AND PELVIS WITHOUT CONTRAST TECHNIQUE: Multidetector CT imaging of the abdomen and pelvis was performed following the standard protocol without IV contrast. COMPARISON:  None. FINDINGS: Lower chest: Lung bases are within normal. Hepatobiliary: Normal. Pancreas: Normal. Spleen: Normal. Adrenals/Urinary Tract: Adrenal glands are normal. Kidneys are normal in size without hydronephrosis. No evidence of renal stones. Ureters and bladder are normal. Stomach/Bowel: Stomach and small bowel are normal. The appendix is normal. Minimal diverticulosis of the colon. Vascular/Lymphatic: Normal. Reproductive: Normal. Other: No free fluid or focal inflammatory change. Musculoskeletal: Mild degenerative change of the spine. IMPRESSION: No acute findings in the abdomen/pelvis. Minimal diverticulosis of the colon. Electronically Signed   By: Elberta Fortisaniel  Boyle M.D.   On: 04/29/2018 18:08   [place] Abdomen Limited Ruq  Result Date: 04/29/2018 CLINICAL DATA:  Right abdominal pain beginning today. EXAM: ULTRASOUND ABDOMEN LIMITED RIGHT UPPER QUADRANT COMPARISON:  CT earlier same day. FINDINGS:  Gallbladder: No gallstones or wall thickening visualized. No sonographic Murphy sign noted by sonographer. Common bile duct: Diameter: 2 mm common normal Liver: Slightly echogenic liver suggesting mild fatty change. No focal lesion or ductal dilatation. Portal vein is patent on color Doppler imaging with normal direction of blood flow towards the liver. IMPRESSION: No acute finding. No evidence of gallstones or biliary obstruction. Possible mild fatty change of the liver. Electronically Signed   By: Paulina FusiMark  Shogry M.D.   On: 04/29/2018 18:50    ____________________________________________   PROCEDURES Procedures  ____________________________________________  DIFFERENTIAL DIAGNOSIS   Appendicitis, intestinal gas pain, diverticulitis  CLINICAL IMPRESSION / ASSESSMENT AND PLAN / ED COURSE  Pertinent labs & imaging results that were available during my  care of the patient were reviewed by me and considered in my medical decision making (see chart for details).      Clinical Course as of Apr 30 1252  Fri Apr 30, 2018  0814 Worsening right lower quadrant abdominal pain, constant for the past 24 hours.  Electronic medical record reviewed, noted patient had a normal noncontrast CT scan of the abdomen yesterday as well as a normal ultrasound of the right upper quadrant yesterday.  Labs today are unremarkable and unchanged from yesterday.  Given progressing symptoms and exam concerning for appendicitis, I will obtain a repeat CT scan today with oral and IV contrast.    [PS]  4098 IV fluids for hydration, IV Zofran 4 mg for nausea control.  Toradol IV 15 mg for pain control.    [PS]  0820 [facility] reviewed, 1 Vicodin prescription in October 2018 found in the last 2 years.   [PS]    Clinical Course User Index [PS] Sharman Cheek, MD     ----------------------------------------- 12:56 PM on 04/30/2018 -----------------------------------------  Work-up entirely negative.  Vital signs  normal.  Will discharge home to continue outpatient management.  Highly doubt STI PID TOA or torsion, otherwise no identifiable cause of her abdominal pain.  ____________________________________________   FINAL CLINICAL IMPRESSION(S) / ED DIAGNOSES    Final diagnoses:  Right lower quadrant abdominal pain     ED Discharge Orders        Ordered    acetaminophen (TYLENOL) 325 MG tablet  Every 6 hours PRN     04/30/18 1252    ondansetron (ZOFRAN ODT) 4 MG disintegrating tablet  Every 8 hours PRN     04/30/18 1252    famotidine (PEPCID) 20 MG tablet  2 times daily     04/30/18 1252    aluminum-magnesium hydroxide-simethicone (MAALOX) 200-200-20 MG/5ML SUSP  3 times daily before meals & bedtime     04/30/18 1253      Portions of this note were generated with dragon dictation software. Dictation errors may occur despite best attempts at proofreading.    Sharman Cheek, MD 04/30/18 1256

## 2018-04-30 NOTE — Discharge Instructions (Signed)
Your labs and CT scan today were unremarkable.  We are unable to find a cause for your pain.  Take medications as prescribed to help control your symptoms and follow up with your doctor.

## 2018-04-30 NOTE — ED Notes (Signed)
Unable to void at this time. Pt given specimen cup for when she is able.

## 2018-05-03 ENCOUNTER — Encounter: Payer: Self-pay | Admitting: Adult Health

## 2018-05-03 ENCOUNTER — Ambulatory Visit (INDEPENDENT_AMBULATORY_CARE_PROVIDER_SITE_OTHER): Payer: No Typology Code available for payment source | Admitting: Adult Health

## 2018-05-03 VITALS — BP 153/92 | HR 82 | Ht 67.01 in | Wt 232.0 lb

## 2018-05-03 DIAGNOSIS — R1011 Right upper quadrant pain: Secondary | ICD-10-CM

## 2018-05-03 DIAGNOSIS — I10 Essential (primary) hypertension: Secondary | ICD-10-CM | POA: Diagnosis not present

## 2018-05-03 MED ORDER — LOSARTAN POTASSIUM 25 MG PO TABS: 25.0000 mg | ORAL_TABLET | Freq: Every day | ORAL | 0 refills | Status: DC

## 2018-05-03 MED ORDER — CYCLOBENZAPRINE HCL 10 MG PO TABS: 10.0000 mg | ORAL_TABLET | Freq: Three times a day (TID) | ORAL | 0 refills | Status: DC | PRN

## 2018-05-03 NOTE — Progress Notes (Signed)
Subjective:    Patient ID: Sylvia Warren, female    DOB: 06-18-1981, 37 y.o.   MRN: [id number]  HPI:  Ms. Aaronson was seen in ED 04/29/18 and 04/30/18 for R abdominal pain with radiation to R middle/lower back.  She denies acute injury/accident prior to onset of sx's. She had a negative noncontrast CT scan of the abdomen as well as a negative ultrasound of the right upper quadrant. CT Abd/Pelvis with contrast completed 04/30/18- essentially neg with  "Bibasilar atelectasis and minimal RIGHT pleural effusion"- stressed the importance of smoking cessation.   UA- neg in ED Mildly elevated WBC- 13.3, she denies fever/chills She has not used Nicoderm patches yet. She was instructed  To use Acetaminophen, Zofran, Maalox as needed and to start Pepcid BID- she has not used any these medications. She did not take her antihypertensive this am (lisinopril 10mg ) She would like to stop Lisinopril and change classes of medication. She denies N/V/D She denies CP/dyspnea She denies hematuria/hematochezia She denies urinary sx's She reports insomnia r/t discomfort/pain  She reports "smoker's cough", denies acute URI sx's   Patient Care Team    Relationship Specialty Notifications Start End  William Hamburger D, NP PCP - General Family Medicine  12/16/17   [facility], [place]    12/16/17   [facility]  12/17/17     Patient Active Problem List   Diagnosis Date Noted  . RUQ pain 05/03/2018  . Vitamin D deficiency 02/10/2018  . Migraine with status migrainosus, not intractable 02/10/2018  . Elevated blood pressure reading 12/16/2017  . HTN, goal below 140/90 12/16/2017  . Tobacco use disorder 12/16/2017  . Healthcare maintenance 12/16/2017     Past Medical History:  Diagnosis Date  . Hypertension      Past Surgical History:  Procedure Laterality Date  . CESAREAN SECTION    . KNEE ARTHROSCOPY    . TUBAL LIGATION       Family History  Problem Relation Age of  Onset  . Hypertension Mother   . Cirrhosis Mother   . Kidney disease Mother   . Healthy Sister   . Healthy Daughter   . Healthy Son   . Cancer Maternal Aunt        vulvular  . Diabetes Paternal Grandmother   . Healthy Sister   . Healthy Sister   . Healthy Son      Social History   Substance and Sexual Activity  Drug Use No     Social History   Substance and Sexual Activity  Alcohol Use No  . Frequency: Never     Social History   Tobacco Use  Smoking Status Current Every Day Smoker  . Packs/day: 1.00  . Years: 18.00  . Pack years: 18.00  . Types: Cigarettes  Smokeless Tobacco Never Used     Outpatient Encounter Medications as of 05/03/2018  Medication Sig  . acetaminophen (TYLENOL) 325 MG tablet Take 2 tablets (650 mg total) by mouth every 6 (six) hours as needed.  . famotidine (PEPCID) 20 MG tablet Take 1 tablet (20 mg total) by mouth 2 (two) times daily.  . nicotine (NICODERM CQ - DOSED IN MG/24 HOURS) 14 mg/24hr patch Place 1 patch (14 mg total) onto the skin daily.  . rizatriptan (MAXALT) 5 MG tablet Take 1 tablet (5 mg total) by mouth as needed for migraine. May repeat in 2 hours if needed  . Vitamin D, Ergocalciferol, (DRISDOL) 50000 units CAPS capsule Take 1  capsule (50,000 Units total) by mouth every 7 (seven) days.  . [DISCONTINUED] lisinopril (PRINIVIL,ZESTRIL) 10 MG tablet Take 1 tablet (10 mg total) by mouth daily.  Marland Kitchen. aluminum-magnesium hydroxide-simethicone (MAALOX) 200-200-20 MG/5ML SUSP Take 30 mLs by mouth 4 (four) times daily -  before meals and at bedtime. (Patient not taking: Reported on 05/03/2018)  . amoxicillin (AMOXIL) 500 MG capsule   . cyclobenzaprine (FLEXERIL) 10 MG tablet Take 1 tablet (10 mg total) by mouth 3 (three) times daily as needed for muscle spasms.  Marland Kitchen. losartan (COZAAR) 25 MG tablet Take 1 tablet (25 mg total) by mouth daily.  . ondansetron (ZOFRAN ODT) 4 MG disintegrating tablet Take 1 tablet (4 mg total) by mouth every 8  (eight) hours as needed for nausea or vomiting. (Patient not taking: Reported on 05/03/2018)   No facility-administered encounter medications on file as of 05/03/2018.     Allergies: Patient has no known allergies.  Body mass index is 36.33 kg/m.  Blood pressure (!) 153/92, pulse 82, height 5' 7.01" (1.702 m), weight 232 lb (105.2 kg), last menstrual period 04/19/2018, SpO2 100 %.   Review of Systems  Constitutional: Positive for activity change. Negative for appetite change, chills, diaphoresis, fatigue, fever and unexpected weight change.  Eyes: Negative for visual disturbance.  Respiratory: Negative for cough, chest tightness, shortness of breath, wheezing and stridor.   Cardiovascular: Negative for chest pain, palpitations and leg swelling.  Gastrointestinal: Positive for abdominal pain. Negative for abdominal distention, blood in stool, constipation, diarrhea, nausea and vomiting.  Genitourinary: Positive for flank pain. Negative for difficulty urinating and hematuria.  Musculoskeletal: Positive for back pain and joint swelling. Negative for arthralgias, gait problem, myalgias, neck pain and neck stiffness.  Skin: Negative for color change, pallor, rash and wound.  Neurological: Negative for dizziness and headaches.  Hematological: Does not bruise/bleed easily.  Psychiatric/Behavioral: Positive for sleep disturbance.       Objective:   Physical Exam  Constitutional: She is oriented to person, place, and time. She appears well-developed and well-nourished. No distress.  HENT:  Head: Normocephalic and atraumatic.  Right Ear: External ear normal.  Left Ear: External ear normal.  Eyes: Pupils are equal, round, and reactive to light. EOM are normal.  Cardiovascular: Normal rate, regular rhythm, normal heart sounds and intact distal pulses.  No murmur heard. Pulmonary/Chest: Breath sounds normal. No stridor. No respiratory distress. She has no wheezes. She has no rales. She  exhibits no tenderness.  Abdominal: Soft. Bowel sounds are normal. She exhibits no distension and no mass. There is tenderness in the right upper quadrant. There is CVA tenderness. There is no rebound, no guarding, no tenderness at McBurney's point and negative Murphy's sign.  R CVA tenderness with palpation  Musculoskeletal: She exhibits tenderness.  Neurological: She is alert and oriented to person, place, and time.  Skin: Skin is warm and dry. Capillary refill takes less than 2 seconds. No rash noted. She is not diaphoretic. No erythema. No pallor.  Psychiatric: She has a normal mood and affect. Her behavior is normal. Judgment and thought content normal.      Assessment & Plan:   1. HTN, goal below 140/90   2. RUQ pain     HTN, goal below 140/90 BP above goal 153/92, HR 82 She did not take Lisinopril 10mg  this am She requested changing classes of anti-hypertensive. Lisinopril d/c'd and started on Losartan 25mg  QD Check BP/HR daily, bring log to f/u in 4 weeks  RUQ pain All recent  imagining neg Possibly r/t to costochrondritis- care instructions and muscle relaxer provided  Reviewed all labs, imaging, and notes from two recent ED visits.   FOLLOW-UP:  Return in about 1 month (around 05/31/2018) for Regular Follow Up, Evaluate Medication Effectiveness, HTN.

## 2018-05-03 NOTE — Assessment & Plan Note (Signed)
BP above goal 153/92, HR 82 She did not take Lisinopril 10mg  this am She requested changing classes of anti-hypertensive. Lisinopril d/c'd and started on Losartan 25mg  QD Check BP/HR daily, bring log to f/u in 4 weeks

## 2018-05-03 NOTE — Assessment & Plan Note (Signed)
All recent imagining neg Possibly r/t to costochrondritis- care instructions and muscle relaxer provided

## 2018-05-03 NOTE — Patient Instructions (Signed)
Costochondritis Costochondritis is swelling and irritation (inflammation) of the tissue (cartilage) that connects your ribs to your breastbone (sternum). This causes pain in the front of your chest. The pain usually starts gradually and involves more than one rib. What are the causes? The exact cause of this condition is not always known. It results from stress on the cartilage where your ribs attach to your sternum. The cause of this stress could be:  Chest injury (trauma).  Exercise or activity, such as lifting.  Severe coughing.  What increases the risk? You may be at higher risk for this condition if you:  Are female.  Are 30?40 years old.  Recently started a new exercise or work activity.  Have low levels of vitamin D.  Have a condition that makes you cough frequently.  What are the signs or symptoms? The main symptom of this condition is chest pain. The pain:  Usually starts gradually and can be sharp or dull.  Gets worse with deep breathing, coughing, or exercise.  Gets better with rest.  May be worse when you press on the sternum-rib connection (tenderness).  How is this diagnosed? This condition is diagnosed based on your symptoms, medical history, and a physical exam. Your health care provider will check for tenderness when pressing on your sternum. This is the most important finding. You may also have tests to rule out other causes of chest pain. These may include:  A chest X-ray to check for lung problems.  An electrocardiogram (ECG) to see if you have a heart problem that could be causing the pain.  An imaging scan to rule out a chest or rib fracture.  How is this treated? This condition usually goes away on its own over time. Your health care provider may prescribe an NSAID to reduce pain and inflammation. Your health care provider may also suggest that you:  Rest and avoid activities that make pain worse.  Apply heat or cold to the area to reduce pain  and inflammation.  Do exercises to stretch your chest muscles.  If these treatments do not help, your health care provider may inject a numbing medicine at the sternum-rib connection to help relieve the pain. Follow these instructions at home:  Avoid activities that make pain worse. This includes any activities that use chest, abdominal, and side muscles.  If directed, put ice on the painful area: ? Put ice in a plastic bag. ? Place a towel between your skin and the bag. ? Leave the ice on for 20 minutes, 2-3 times a day.  If directed, apply heat to the affected area as often as told by your health care provider. Use the heat source that your health care provider recommends, such as a moist heat pack or a heating pad. ? Place a towel between your skin and the heat source. ? Leave the heat on for 20-30 minutes. ? Remove the heat if your skin turns bright red. This is especially important if you are unable to feel pain, heat, or cold. You may have a greater risk of getting burned.  Take over-the-counter and prescription medicines only as told by your health care provider.  Return to your normal activities as told by your health care provider. Ask your health care provider what activities are safe for you.  Keep all follow-up visits as told by your health care provider. This is important. Contact a health care provider if:  You have chills or a fever.  Your pain does not go   away or it gets worse.  You have a cough that does not go away (is persistent). Get help right away if:  You have shortness of breath. This information is not intended to replace advice given to you by your health care provider. Make sure you discuss any questions you have with your health care provider. Document Released: 08/13/2005 Document Revised: 05/23/2016 Document Reviewed: 02/27/2016 Elsevier Interactive Patient Education  2018 [employer]  Stop Lisinopril and start Losartan 25mg  once daily. Check BP  and HR daily and bring log with you to follow-up in 4 weeks. Please use Cyclobenzaprine as needed and follow above care instructions. Please call clinic with any questions/concerns. Follow-up 4 weeks, re: hypertension and change in anti-hypertensive FEEL BETTER!

## 2018-06-03 ENCOUNTER — Ambulatory Visit: Payer: Self-pay | Admitting: Adult Health

## 2018-06-16 ENCOUNTER — Ambulatory Visit (INDEPENDENT_AMBULATORY_CARE_PROVIDER_SITE_OTHER): Payer: No Typology Code available for payment source | Admitting: Adult Health

## 2018-06-16 ENCOUNTER — Encounter: Payer: Self-pay | Admitting: Adult Health

## 2018-06-16 VITALS — BP 155/83 | HR 81 | Ht 66.25 in | Wt 235.7 lb

## 2018-06-16 DIAGNOSIS — M79675 Pain in left toe(s): Secondary | ICD-10-CM | POA: Insufficient documentation

## 2018-06-16 MED ORDER — PREDNISONE 20 MG PO TABS: ORAL_TABLET | ORAL | 0 refills | Status: DC

## 2018-06-16 NOTE — Assessment & Plan Note (Signed)
Please take prednsione taper as directed. Please use OTC Ibuprofen 800mg  every 8 hrs as needed for pain/swelling. We will call when lab results are available. If pain continues after Prednisone taper completed, please call clinic and we will place referral to Podiatrist.

## 2018-06-16 NOTE — Progress Notes (Signed)
Subjective:    Patient ID: Sylvia Warren, female    DOB: 11/13/1981, 37 y.o.   MRN: [id number]  HPI:  Sylvia Warren presents with intermittent pain of L great toe and "ball of my foot" that spontaneously started 3 days ago. Pain is throbbing, rates 3/10 She also reports warmth and redness of L great toe. She denies acute injury/trauma prior to onset of sx's. She denies personal or known family hx of gout (she has no medical  Hx on her father). She denies ETOH use She denies recent seafood ingestion, but does eat a "fair amount of cheese" She denies previous L toe/foot pain She reports pain will worsen with prolonged standing or walking She denies numbness/tingling in L toes  Patient Care Team    Relationship Specialty Notifications Start End  Julaine Fusianford, Katy D, NP PCP - General Family Medicine  12/16/17   [facility] [place]    12/16/17   [facility]  12/17/17     Patient Active Problem List   Diagnosis Date Noted  . Great toe pain, left 06/16/2018  . RUQ pain 05/03/2018  . Vitamin D deficiency 02/10/2018  . Migraine with status migrainosus, not intractable 02/10/2018  . Elevated blood pressure reading 12/16/2017  . HTN, goal below 140/90 12/16/2017  . Tobacco use disorder 12/16/2017  . Healthcare maintenance 12/16/2017     Past Medical History:  Diagnosis Date  . Hypertension      Past Surgical History:  Procedure Laterality Date  . CESAREAN SECTION    . KNEE ARTHROSCOPY    . TUBAL LIGATION       Family History  Problem Relation Age of Onset  . Hypertension Mother   . Cirrhosis Mother   . Kidney disease Mother   . Healthy Sister   . Healthy Daughter   . Healthy Son   . Cancer Maternal Aunt        vulvular  . Diabetes Paternal Grandmother   . Healthy Sister   . Healthy Sister   . Healthy Son      Social History   Substance and Sexual Activity  Drug Use No     Social History   Substance and Sexual Activity  Alcohol  Use No  . Frequency: Never     Social History   Tobacco Use  Smoking Status Current Every Day Smoker  . Packs/day: 1.00  . Years: 18.00  . Pack years: 18.00  . Types: Cigarettes  Smokeless Tobacco Never Used     Outpatient Encounter Medications as of 06/16/2018  Medication Sig  . ibuprofen (ADVIL,MOTRIN) 200 MG tablet Take 400 mg by mouth every 6 (six) hours as needed.  Marland Kitchen. losartan (COZAAR) 25 MG tablet Take 1 tablet (25 mg total) by mouth daily.  . rizatriptan (MAXALT) 5 MG tablet Take 1 tablet (5 mg total) by mouth as needed for migraine. May repeat in 2 hours if needed  . predniSONE (DELTASONE) 20 MG tablet 1 tablet twice daily for 3 days, then 1 tablet daily for 3 days  . [DISCONTINUED] acetaminophen (TYLENOL) 325 MG tablet Take 2 tablets (650 mg total) by mouth every 6 (six) hours as needed.  . [DISCONTINUED] aluminum-magnesium hydroxide-simethicone (MAALOX) 200-200-20 MG/5ML SUSP Take 30 mLs by mouth 4 (four) times daily -  before meals and at bedtime. (Patient not taking: Reported on 05/03/2018)  . [DISCONTINUED] amoxicillin (AMOXIL) 500 MG capsule   . [DISCONTINUED] cyclobenzaprine (FLEXERIL) 10 MG tablet Take 1 tablet (10 mg total)  by mouth 3 (three) times daily as needed for muscle spasms.  . [DISCONTINUED] famotidine (PEPCID) 20 MG tablet Take 1 tablet (20 mg total) by mouth 2 (two) times daily.  . [DISCONTINUED] nicotine (NICODERM CQ - DOSED IN MG/24 HOURS) 14 mg/24hr patch Place 1 patch (14 mg total) onto the skin daily.  . [DISCONTINUED] ondansetron (ZOFRAN ODT) 4 MG disintegrating tablet Take 1 tablet (4 mg total) by mouth every 8 (eight) hours as needed for nausea or vomiting. (Patient not taking: Reported on 05/03/2018)  . [DISCONTINUED] Vitamin D, Ergocalciferol, (DRISDOL) 50000 units CAPS capsule Take 1 capsule (50,000 Units total) by mouth every 7 (seven) days.   No facility-administered encounter medications on file as of 06/16/2018.     Allergies: Patient has no  known allergies.  Body mass index is 37.76 kg/m.  Blood pressure (!) 155/83, pulse 81, height 5' 6.25" (1.683 m), weight 235 lb 11.2 oz (106.9 kg), last menstrual period 06/09/2018, SpO2 94 %.  Review of Systems  Constitutional: Positive for fatigue. Negative for activity change, appetite change, chills, diaphoresis, fever and unexpected weight change.  Respiratory: Negative for cough, chest tightness, shortness of breath, wheezing and stridor.   Cardiovascular: Negative for chest pain, palpitations and leg swelling.  Musculoskeletal: Positive for arthralgias, gait problem, joint swelling and myalgias. Negative for back pain, neck pain and neck stiffness.  Skin: Negative for color change, pallor, rash and wound.  Neurological: Negative for dizziness and headaches.  Hematological: Does not bruise/bleed easily.  Psychiatric/Behavioral: Negative for behavioral problems, confusion, decreased concentration, dysphoric mood, hallucinations, self-injury, sleep disturbance and suicidal ideas. The patient is not nervous/anxious and is not hyperactive.        Objective:   Physical Exam  Constitutional: She is oriented to person, place, and time. She appears well-developed and well-nourished. No distress.  HENT:  Head: Normocephalic and atraumatic.  Right Ear: External ear normal.  Left Ear: External ear normal.  Nose: Nose normal.  Mouth/Throat: Oropharynx is clear and moist.  Eyes: Pupils are equal, round, and reactive to light. Conjunctivae and EOM are normal.  Cardiovascular: Normal rate, regular rhythm, normal heart sounds and intact distal pulses.  No murmur heard. Pulmonary/Chest: Effort normal and breath sounds normal. No respiratory distress.  Musculoskeletal: She exhibits edema and tenderness.       Right ankle: Normal.       Left ankle: Normal.       Right foot: Normal.       Left foot: There is tenderness and swelling. There is normal range of motion and normal capillary refill.   Warmth, tenderness, erythema over L first proximal phalanx   Neurological: She is alert and oriented to person, place, and time.  Skin: Skin is warm and dry. Capillary refill takes less than 2 seconds. No rash noted. She is not diaphoretic. No erythema. No pallor.  Psychiatric: She has a normal mood and affect. Her behavior is normal. Judgment and thought content normal.  Nursing note and vitals reviewed.     Assessment & Plan:   1. Great toe pain, left     Great toe pain, left Please take prednsione taper as directed. Please use OTC Ibuprofen 800mg  every 8 hrs as needed for pain/swelling. We will call when lab results are available. If pain continues after Prednisone taper completed, please call clinic and we will place referral to Podiatrist.    FOLLOW-UP:  No follow-ups on file.

## 2018-06-16 NOTE — Patient Instructions (Addendum)
Uric Acid Test Why am I having this test? Uric acid is a chemical that results when certain substances in your body (purines) break down. You have purines in all your cells. When cells die, they release purines into your blood. You can also get purines from your diet. They are found in foods such as liver, mackerel, beans, and beer. Uric acid is mostly removed from your body by your kidneys. If your body is making too much uric acid, or if your kidneys are not removing it, a crystal form of uric acid can build up in your joints or kidneys. Uric acid crystals in your joints can cause a type of arthritis (gout). Crystals in your urine can form kidney stones. What kind of sample is taken? Uric acid can be measured in blood and urine, so you may have either a blood test or a urine test.  Blood test. This test requires a blood sample taken from a vein in your hand or arm. You may have this test if you have joint pain that may be due to gout. You may also have this test if you are getting a type of cancer treatment that increases cell death and purines. This can lead to high uric acid levels.  Urine test. This test requires a sample of urine. You may need to collect all the urine you produce over a 24-hour period (24-hour urine sample). You may have the urine test if you have kidney stones. Finding out how much uric acid you are passing in your urine can help your health care provider determine the cause of your kidney stones.  How do I prepare for this test?  You may not be able to eat for 4 hours before the blood test or as directed by your health care provider.  Let your health care provider know if: ? You have recently exercised a lot. Heavy exercise can increase uric acid levels. ? You are taking any medicines. Some medicines can also affect uric acid. What do the results mean? Your results will be compared with a reference range of values for this test. The reference ranges for the blood test are  as follows:  Adult: ? Female: 4.0-8.5 mg/dL or 0.24-0.51 mmol/L. ? Female: 2.7-7.3 mg/dL or 0.16-0.43 mmol/L.  Child: 2.5-5.5 mg/dL or 0.12-0.32 mmol/L.  Newborn: 2.0-6.2 mg/dL.  The reference range for the urine test is 250-750 mg per 24 hours or 1.48-4.43 mmol per day (SI units). Abnormally high levels of uric acid may mean:  Your body is making too much uric acid.  Your kidneys are not removing enough uric acid.  You may need to have other tests. Many things can cause a high level of uric acid. Common causes include:  Gout.  Kidney disease.  Cancer or cancer treatment.  A diet high in purines.  Alcohol abuse.  Diabetes.  Certain medicines can cause low levels of uric acid, but this is usually not a cause for concern. Reference rangesmay vary among different labs and hospitals. Talk to your health care provider to discuss your results, treatment options, and if necessary, the need for more tests. It is your responsibility to obtain your test results. Ask the lab or department performing the test when and how you will get your results. Talk with your health care provider if you have any questions about your results. Talk with your health care provider to discuss your results, treatment options, and if necessary, the need for more tests. Talk with your health care   provider if you have any questions about your results. This information is not intended to replace advice given to you by your health care provider. Make sure you discuss any questions you have with your health care provider. Document Released: 11/28/2004 Document Revised: 07/09/2016 Document Reviewed: 02/27/2014 Elsevier Interactive Patient Education  2018 [employer]   Please take prednsione taper as directed. Please use OTC Ibuprofen 800mg  every 8 hrs as needed for pain/swelling. We will call when lab results are available. If pain continues after Prednisone taper completed, please call clinic and we will  place referral to Podiatrist. CONGRATULATIONS ON FINISHING YOUR DEGREE!

## 2018-06-17 ENCOUNTER — Other Ambulatory Visit: Payer: Self-pay | Admitting: Adult Health

## 2018-06-17 LAB — URIC ACID: Uric Acid: 6.4 mg/dL (ref 2.5–7.1)

## 2018-06-17 MED ORDER — COLCHICINE 0.6 MG PO TABS: ORAL_TABLET | ORAL | 0 refills | Status: DC

## 2018-07-12 ENCOUNTER — Encounter: Payer: Self-pay | Admitting: Adult Health

## 2018-07-12 ENCOUNTER — Ambulatory Visit (INDEPENDENT_AMBULATORY_CARE_PROVIDER_SITE_OTHER): Payer: No Typology Code available for payment source | Admitting: Adult Health

## 2018-07-12 VITALS — BP 165/86 | HR 72 | Ht 66.25 in | Wt 235.3 lb

## 2018-07-12 DIAGNOSIS — G4459 Other complicated headache syndrome: Secondary | ICD-10-CM | POA: Diagnosis not present

## 2018-07-12 DIAGNOSIS — I10 Essential (primary) hypertension: Secondary | ICD-10-CM | POA: Diagnosis not present

## 2018-07-12 DIAGNOSIS — G43901 Migraine, unspecified, not intractable, with status migrainosus: Secondary | ICD-10-CM | POA: Diagnosis not present

## 2018-07-12 MED ORDER — RIZATRIPTAN BENZOATE 5 MG PO TABS: 5.0000 mg | ORAL_TABLET | ORAL | 1 refills | Status: DC | PRN

## 2018-07-12 MED ORDER — LISINOPRIL 10 MG PO TABS: 10.0000 mg | ORAL_TABLET | Freq: Every day | ORAL | 3 refills | Status: DC

## 2018-07-12 NOTE — Progress Notes (Signed)
Subjective:    Patient ID: Sylvia Warren, female    DOB: 07/10/1981, 37 y.o.   MRN: [id number]  HPI:  Ms. Pacifico presents with HA "that has been present >4 days" She denies acute head injury/prior on onset of sx's. She reports pain is above OS and radiates to L occipital head. Pain is constant, described as "throbbing" and rated 7/10. She was initially dx'd with migraines between ages [age], she has never had MRI or been evaluated by Neurology. She currently reports nausea without vomiting, sensitivity to light/sound, and dizziness. She denies visual disturbance or migraine with aura hx She reports rest will help with pain She reports taking OTC Acetaminophen/Ibuprofen without sx relief She reports taking Maxalt without rsx relief. She denies this being "the worst headache ever" She reports switching to "half-caff" coffee and continues to smoke 1/2-1 pack/day She denies use ETOH  Patient Care Team    Relationship Specialty Notifications Start End  Julaine Fusi, NP PCP - General Family Medicine  12/16/17   [facility], [place]    12/16/17   [facility]  12/17/17     Patient Active Problem List   Diagnosis Date Noted  . Other complicated headache syndrome 07/12/2018  . Great toe pain, left 06/16/2018  . RUQ pain 05/03/2018  . Vitamin D deficiency 02/10/2018  . Migraine with status migrainosus, not intractable 02/10/2018  . Elevated blood pressure reading 12/16/2017  . HTN, goal below 140/90 12/16/2017  . Tobacco use disorder 12/16/2017  . Healthcare maintenance 12/16/2017     Past Medical History:  Diagnosis Date  . Hypertension      Past Surgical History:  Procedure Laterality Date  . CESAREAN SECTION    . KNEE ARTHROSCOPY    . TUBAL LIGATION       Family History  Problem Relation Age of Onset  . Hypertension Mother   . Cirrhosis Mother   . Kidney disease Mother   . Healthy Sister   . Healthy Daughter   . Healthy Son   .  Cancer Maternal Aunt        vulvular  . Diabetes Paternal Grandmother   . Healthy Sister   . Healthy Sister   . Healthy Son      Social History   Substance and Sexual Activity  Drug Use No     Social History   Substance and Sexual Activity  Alcohol Use No  . Frequency: Never     Social History   Tobacco Use  Smoking Status Current Every Day Smoker  . Packs/day: 1.00  . Years: 18.00  . Pack years: 18.00  . Types: Cigarettes  Smokeless Tobacco Never Used     Outpatient Encounter Medications as of 07/12/2018  Medication Sig  . ibuprofen (ADVIL,MOTRIN) 200 MG tablet Take 400 mg by mouth every 6 (six) hours as needed.  . predniSONE (DELTASONE) 20 MG tablet 1 tablet twice daily for 3 days, then 1 tablet daily for 3 days  . rizatriptan (MAXALT) 5 MG tablet Take 1 tablet (5 mg total) by mouth as needed for migraine. May repeat in 2 hours if needed  . [DISCONTINUED] losartan (COZAAR) 25 MG tablet Take 1 tablet (25 mg total) by mouth daily.  . [DISCONTINUED] rizatriptan (MAXALT) 5 MG tablet Take 1 tablet (5 mg total) by mouth as needed for migraine. May repeat in 2 hours if needed  . lisinopril (PRINIVIL,ZESTRIL) 10 MG tablet Take 1 tablet (10 mg total) by mouth daily.  . [  DISCONTINUED] colchicine 0.6 MG tablet Day 1 of flare-2 tabs, then 1 tab one hr later. Max daily dose 1.8mg . Remaining days during acute flare 1 or 2 tabs daily until flare resolves   No facility-administered encounter medications on file as of 07/12/2018.     Allergies: Patient has no known allergies.  Body mass index is 37.69 kg/m.  Blood pressure (!) 165/86, pulse 72, height 5' 6.25" (1.683 m), weight 235 lb 4.8 oz (106.7 kg), last menstrual period 07/07/2018, SpO2 100 %.  Review of Systems  Constitutional: Positive for appetite change and fatigue. Negative for activity change, chills, diaphoresis, fever and unexpected weight change.  HENT: Negative for congestion and postnasal drip.   Eyes:  Negative for visual disturbance.  Respiratory: Negative for cough, chest tightness, shortness of breath, wheezing and stridor.   Cardiovascular: Negative for chest pain, palpitations and leg swelling.  Gastrointestinal: Positive for nausea. Negative for abdominal distention, abdominal pain, blood in stool, constipation, diarrhea and vomiting.  Endocrine: Negative for cold intolerance, heat intolerance, polydipsia, polyphagia and polyuria.  Genitourinary: Negative for difficulty urinating and flank pain.  Skin: Negative for color change, pallor, rash and wound.  Neurological: Positive for dizziness and headaches. Negative for tremors, seizures, speech difficulty, weakness, light-headedness and numbness.  Hematological: Does not bruise/bleed easily.  Psychiatric/Behavioral: Positive for sleep disturbance. Negative for behavioral problems, confusion, decreased concentration, dysphoric mood, hallucinations, self-injury and suicidal ideas. The patient is not nervous/anxious and is not hyperactive.        Objective:   Physical Exam  Constitutional: She is oriented to person, place, and time. She appears well-developed and well-nourished. She appears ill.  Squinting due to light sensitivity during OV  HENT:  Head: Normocephalic and atraumatic.  Mouth/Throat: Oropharynx is clear and moist.  Eyes: Pupils are equal, round, and reactive to light. EOM are normal.  Neck: Normal range of motion. Neck supple.  Cardiovascular: Normal rate, regular rhythm, normal heart sounds and intact distal pulses.  Pulmonary/Chest: Effort normal and breath sounds normal.  Abdominal: Soft. Bowel sounds are normal.  Neurological: She is alert and oriented to person, place, and time. She has normal strength and normal reflexes. She is not disoriented. No cranial nerve deficit or sensory deficit. She displays a negative Romberg sign.  Skin: Skin is warm and dry. Capillary refill takes less than 2 seconds.  Psychiatric: She  has a normal mood and affect. Her behavior is normal. Her mood appears not anxious. She is not agitated.      Assessment & Plan:   1. Migraine with status migrainosus, not intractable, unspecified migraine type   2. Other complicated headache syndrome   3. HTN, goal below 140/90    Migraine with status migrainosus, not intractable Remain well hydrated and eat a well balanced diet. Reduce-stop tobacco use. MRI without contrast ordered. Continue with OTC Acetaminophen and Ibuprofen. Continue with Maxalt as needed. Referral to Neurology placed. Work excuse provided, okay to return Wed 07/14/18 .  HTN, goal below 140/90 BP above goal Stop Losartan and start Lisinopril 10mg  once daily. Check and records daily BP and HR Return in 2-3 weeks fof BP check  FOLLOW-UP:  Return in about 3 weeks (around 08/02/2018) for HTN.

## 2018-07-12 NOTE — Patient Instructions (Addendum)
Migraine Headache A migraine headache is an intense, throbbing pain on one side or both sides of the head. Migraines may also cause other symptoms, such as nausea, vomiting, and sensitivity to light and noise. What are the causes? Doing or taking certain things may also trigger migraines, such as:  Alcohol.  Smoking.  Medicines, such as: ? Medicine used to treat chest pain (nitroglycerine). ? Birth control pills. ? Estrogen pills. ? Certain blood pressure medicines.  Aged cheeses, chocolate, or caffeine.  Foods or drinks that contain nitrates, glutamate, aspartame, or tyramine.  Physical activity.  Other things that may trigger a migraine include:  Menstruation.  Pregnancy.  Hunger.  Stress, lack of sleep, too much sleep, or fatigue.  Weather changes.  What increases the risk? The following factors may make you more likely to experience migraine headaches:  Age. Risk increases with age.  Family history of migraine headaches.  Being Caucasian.  Depression and anxiety.  Obesity.  Being a woman.  Having a hole in the heart (patent foramen ovale) or other heart problems.  What are the signs or symptoms? The main symptom of this condition is pulsating or throbbing pain. Pain may:  Happen in any area of the head, such as on one side or both sides.  Interfere with daily activities.  Get worse with physical activity.  Get worse with exposure to bright lights or loud noises.  Other symptoms may include:  Nausea.  Vomiting.  Dizziness.  General sensitivity to bright lights, loud noises, or smells.  Before you get a migraine, you may get warning signs that a migraine is developing (aura). An aura may include:  Seeing flashing lights or having blind spots.  Seeing bright spots, halos, or zigzag lines.  Having tunnel vision or blurred vision.  Having numbness or a tingling feeling.  Having trouble talking.  Having muscle weakness.  How is this  diagnosed? A migraine headache can be diagnosed based on:  Your symptoms.  A physical exam.  Tests, such as CT scan or MRI of the head. These imaging tests can help rule out other causes of headaches.  Taking fluid from the spine (lumbar puncture) and analyzing it (cerebrospinal fluid analysis, or CSF analysis).  How is this treated? A migraine headache is usually treated with medicines that:  Relieve pain.  Relieve nausea.  Prevent migraines from coming back.  Treatment may also include:  Acupuncture.  Lifestyle changes like avoiding foods that trigger migraines.  Follow these instructions at home: Medicines  Take over-the-counter and prescription medicines only as told by your health care provider.  Do not drive or use heavy machinery while taking prescription pain medicine.  To prevent or treat constipation while you are taking prescription pain medicine, your health care provider may recommend that you: ? Drink enough fluid to keep your urine clear or pale yellow. ? Take over-the-counter or prescription medicines. ? Eat foods that are high in fiber, such as fresh fruits and vegetables, whole grains, and beans. ? Limit foods that are high in fat and processed sugars, such as fried and sweet foods. Lifestyle  Avoid alcohol use.  Do not use any products that contain nicotine or tobacco, such as cigarettes and e-cigarettes. If you need help quitting, ask your health care provider.  Get at least 8 hours of sleep every night.  Limit your stress. General instructions   Keep a journal to find out what may trigger your migraine headaches. For example, write down: ? What you eat and   drink. ? How much sleep you get. ? Any change to your diet or medicines.  If you have a migraine: ? Avoid things that make your symptoms worse, such as bright lights. ? It may help to lie down in a dark, quiet room. ? Do not drive or use heavy machinery. ? Ask your health care provider  what activities are safe for you while you are experiencing symptoms.  Keep all follow-up visits as told by your health care provider. This is important. Contact a health care provider if:  You develop symptoms that are different or more severe than your usual migraine symptoms. Get help right away if:  Your migraine becomes severe.  You have a fever.  You have a stiff neck.  You have vision loss.  Your muscles feel weak or like you cannot control them.  You start to lose your balance often.  You develop trouble walking.  You faint. This information is not intended to replace advice given to you by your health care provider. Make sure you discuss any questions you have with your health care provider. Document Released: 11/03/2005 Document Revised: 05/23/2016 Document Reviewed: 04/21/2016 Elsevier Interactive Patient Education  2017 [employer]  Remain well hydrated and eat a well balanced diet. Reduce-stop tobacco use. MRI without contrast ordered. Continue with OTC Acetaminophen and Ibuprofen. Continue with Maxalt as needed. Referral to Neurology placed. Work excuse provided, okay to return Wed 07/14/18 Stop Losartan and start Lisinopril 10mg  once daily. Check and records daily BP and HR Return in 2-3 weeks fof BP check. FEEL BETTER!

## 2018-07-12 NOTE — Assessment & Plan Note (Signed)
BP above goal Stop Losartan and start Lisinopril 10mg  once daily. Check and records daily BP and HR Return in 2-3 weeks fof BP check

## 2018-07-12 NOTE — Assessment & Plan Note (Signed)
Remain well hydrated and eat a well balanced diet. Reduce-stop tobacco use. MRI without contrast ordered. Continue with OTC Acetaminophen and Ibuprofen. Continue with Maxalt as needed. Referral to Neurology placed. Work excuse provided, okay to return Wed 07/14/18 .

## 2018-07-17 ENCOUNTER — Ambulatory Visit (HOSPITAL_COMMUNITY)
Admission: RE | Admit: 2018-07-17 | Discharge: 2018-07-17 | Disposition: A | Discharge status: Home / Self Care | Payer: No Typology Code available for payment source | Source: Ambulatory Visit | Attending: Adult Health | Admitting: Adult Health

## 2018-07-17 DIAGNOSIS — G4459 Other complicated headache syndrome: Secondary | ICD-10-CM | POA: Diagnosis present

## 2018-07-20 ENCOUNTER — Ambulatory Visit (INDEPENDENT_AMBULATORY_CARE_PROVIDER_SITE_OTHER): Payer: Self-pay | Admitting: Family Medicine

## 2018-07-20 ENCOUNTER — Encounter: Payer: Self-pay | Admitting: Family Medicine

## 2018-07-20 VITALS — BP 160/100 | HR 79 | Temp 98.1°F | Wt 238.0 lb

## 2018-07-20 DIAGNOSIS — H1032 Unspecified acute conjunctivitis, left eye: Secondary | ICD-10-CM

## 2018-07-20 DIAGNOSIS — H00036 Abscess of eyelid left eye, unspecified eyelid: Secondary | ICD-10-CM

## 2018-07-20 MED ORDER — CEFDINIR 300 MG PO CAPS: 600.0000 mg | ORAL_CAPSULE | Freq: Every day | ORAL | 0 refills | Status: AC

## 2018-07-20 MED ORDER — NEOMYCIN-POLYMYXIN-DEXAMETH 3.5-10000-0.1 OP SUSP: 2.0000 [drp] | OPHTHALMIC | 0 refills | Status: AC

## 2018-07-20 MED ORDER — NEOMYCIN-POLYMYXIN-HC 3.5-10000-1 OP SUSP: 3.0000 [drp] | Freq: Three times a day (TID) | OPHTHALMIC | 0 refills | Status: DC

## 2018-07-20 NOTE — Patient Instructions (Addendum)
Please follow-up with your primary care regarding your blood pressure.  Take all antibiotic as directed.   Apply drops as prescribed.   Wash hands frequently to reduce exposure of bacteria to unaffected eye.     Bacterial Conjunctivitis Bacterial conjunctivitis is an infection of your conjunctiva. This is the clear membrane that covers the white part of your eye and the inner surface of your eyelid. This condition can make your eye:  Red or pink.  Itchy.  This condition is caused by bacteria. This condition spreads very easily from person to person (is contagious) and from one eye to the other eye. Follow these instructions at home: Medicines  Take or apply your antibiotic medicine as told by your doctor. Do not stop taking or applying the antibiotic even if you start to feel better.  Take or apply over-the-counter and prescription medicines only as told by your doctor.  Do not touch your eyelid with the eye drop bottle or the ointment tube. Managing discomfort  Wipe any fluid from your eye with a warm, wet washcloth or a cotton ball.  Place a cool, clean washcloth on your eye. Do this for 10-20 minutes, 3-4 times per day. General instructions  Do not wear contact lenses until the irritation is gone. Wear glasses until your doctor says it is okay to wear contacts.  Do not wear eye makeup until your symptoms are gone. Throw away any old makeup.  Change or wash your pillowcase every day.  Do not share towels or washcloths with anyone.  Wash your hands often with soap and water. Use paper towels to dry your hands.  Do not touch or rub your eyes.  Do not drive or use heavy machinery if your vision is blurry. Contact a doctor if:  You have a fever.  Your symptoms do not get better after 10 days. Get help right away if:  You have a fever and your symptoms suddenly get worse.  You have very bad pain when you move your eye.  Your face: ? Hurts. ? Is red. ? Is  swollen.  You have sudden loss of vision. This information is not intended to replace advice given to you by your health care provider. Make sure you discuss any questions you have with your health care provider. Document Released: 08/12/2008 Document Revised: 04/10/2016 Document Reviewed: 08/16/2015 Elsevier Interactive Patient Education  [employer].

## 2018-07-20 NOTE — Progress Notes (Signed)
Patient ID: Sylvia Warren, female    DOB: 05/31/1981, 37 y.o.   MRN: [id number]  PCP: Julaine Fusi, NP  Chief Complaint  Patient presents with  . Belepharitis    Subjective:  HPI Sylvia Warren is a 37 y.o. female presents for evaluation left eye swelling.  Left eye itching and swelling Onset: 1 day ago Left eye itching and swelling. Denies foreign body. Vision intact. Exposure include recently swimming [place] public [place]. No OTC attempted. No URI symptoms. Social History   Socioeconomic History  . Marital status: Married    Spouse name: Not on file  . Number of children: Not on file  . Years of education: Not on file  . Highest education level: Not on file  Occupational History  . Not on file  Social Needs  . Financial resource strain: Not on file  . Food insecurity:    Worry: Not on file    Inability: Not on file  . Transportation needs:    Medical: Not on file    Non-medical: Not on file  Tobacco Use  . Smoking status: Current Every Day Smoker    Packs/day: 1.00    Years: 18.00    Pack years: 18.00    Types: Cigarettes  . Smokeless tobacco: Never Used  Substance and Sexual Activity  . Alcohol use: No    Frequency: Never  . Drug use: No  . Sexual activity: Yes    Birth control/protection: None  Lifestyle  . Physical activity:    Days per week: Not on file    Minutes per session: Not on file  . Stress: Not on file  Relationships  . Social connections:    Talks on phone: Not on file    Gets together: Not on file    Attends religious service: Not on file    Active member of club or organization: Not on file    Attends meetings of clubs or organizations: Not on file    Relationship status: Not on file  . Intimate partner violence:    Fear of current or ex partner: Not on file    Emotionally abused: Not on file    Physically abused: Not on file    Forced sexual activity: Not on file  Other Topics Concern  . Not on file  Social History  Narrative  . Not on file    Family History  Problem Relation Age of Onset  . Hypertension Mother   . Cirrhosis Mother   . Kidney disease Mother   . Healthy Sister   . Healthy Daughter   . Healthy Son   . Cancer Maternal Aunt        vulvular  . Diabetes Paternal Grandmother   . Healthy Sister   . Healthy Sister   . Healthy Son      Review of Systems Pertinent negatives listed in HPI Patient Active Problem List   Diagnosis Date Noted  . Other complicated headache syndrome 07/12/2018  . Great toe pain, left 06/16/2018  . RUQ pain 05/03/2018  . Vitamin D deficiency 02/10/2018  . Migraine with status migrainosus, not intractable 02/10/2018  . Elevated blood pressure reading 12/16/2017  . HTN, goal below 140/90 12/16/2017  . Tobacco use disorder 12/16/2017  . Healthcare maintenance 12/16/2017    No Known Allergies  Prior to Admission medications   Medication Sig Start Date End Date Taking? Authorizing Provider  ibuprofen (ADVIL,MOTRIN) 200 MG tablet Take 400 mg by mouth every 6 (  six) hours as needed.   Yes [provider]  lisinopril (PRINIVIL,ZESTRIL) 10 MG tablet Take 1 tablet (10 mg total) by mouth daily. 07/12/18  Yes Danford, Orpha Bur D, NP  rizatriptan (MAXALT) 5 MG tablet Take 1 tablet (5 mg total) by mouth as needed for migraine. May repeat in 2 hours if needed 07/12/18  Yes Danford, Orpha Bur D, NP  predniSONE (DELTASONE) 20 MG tablet 1 tablet twice daily for 3 days, then 1 tablet daily for 3 days Patient not taking: Reported on 07/20/2018 06/16/18   Julaine Fusi, NP    Past Medical, Surgical Family and Social History reviewed and updated.    Objective:   Today's Vitals   07/20/18 0955  BP: (!) 160/100  Pulse: 79  Temp: 98.1 F (36.7 C)  SpO2: 97%  Weight: 238 lb (108 kg)    Wt Readings from Last 3 Encounters:  07/20/18 238 lb (108 kg)  07/12/18 235 lb 4.8 oz (106.7 kg)  06/16/18 235 lb 11.2 oz (106.9 kg)   Physical Exam  Constitutional: She  appears well-developed and well-nourished.  Eyes: Pupils are equal, round, and reactive to light. EOM are normal. Right eye exhibits no chemosis, no discharge, no exudate and no hordeolum. No foreign body present in the right eye. Left eye exhibits no chemosis, no discharge, no exudate and no hordeolum. No foreign body present in the left eye. Left conjunctiva is injected. No scleral icterus.    Cardiovascular: Normal rate.  Pulmonary/Chest: Effort normal.    Assessment & Plan:  1. Eyelid cellulitis, left 2. Acute bacterial conjunctivitis of left eye  Will treat with dual topical and oral  Antibiotic. Do reduce itching and left eye discomfort,  prescribing neomycin-polymyxin B-dexamethasone  X 4 times daily x 5 days. For cellulitis of eyelid, start Omnicef 600 mg daily x 7 days. Red flags discussed and provided in educational literature.  If symptoms worsen or do not improve, return for follow-up, follow-up with PCP, or at the emergency department if severity of symptoms warrant a higher level of care.   Godfrey Pick. Tiburcio Pea, MSN, FNP-C [facility]  [address]  [place], [place] [address] [phone]

## 2018-08-05 ENCOUNTER — Other Ambulatory Visit: Payer: Self-pay | Admitting: Neurology

## 2018-08-05 ENCOUNTER — Ambulatory Visit (INDEPENDENT_AMBULATORY_CARE_PROVIDER_SITE_OTHER): Payer: No Typology Code available for payment source | Admitting: Neurology

## 2018-08-05 ENCOUNTER — Encounter: Payer: Self-pay | Admitting: Neurology

## 2018-08-05 VITALS — BP 131/86 | HR 79 | Ht 66.5 in | Wt 239.0 lb

## 2018-08-05 DIAGNOSIS — R51 Headache with orthostatic component, not elsewhere classified: Secondary | ICD-10-CM

## 2018-08-05 DIAGNOSIS — G8929 Other chronic pain: Secondary | ICD-10-CM

## 2018-08-05 DIAGNOSIS — G43709 Chronic migraine without aura, not intractable, without status migrainosus: Secondary | ICD-10-CM

## 2018-08-05 DIAGNOSIS — E669 Obesity, unspecified: Secondary | ICD-10-CM

## 2018-08-05 DIAGNOSIS — H539 Unspecified visual disturbance: Secondary | ICD-10-CM

## 2018-08-05 DIAGNOSIS — R519 Headache, unspecified: Secondary | ICD-10-CM

## 2018-08-05 MED ORDER — ERENUMAB-AOOE 140 MG/ML ~~LOC~~ SOAJ: 140.0000 mg | SUBCUTANEOUS | 11 refills | Status: DC

## 2018-08-05 MED ORDER — ZOLMITRIPTAN 5 MG NA SOLN: 1.0000 | NASAL | 0 refills | Status: DC | PRN

## 2018-08-05 MED ORDER — SUMATRIPTAN SUCCINATE 3 MG/0.5ML ~~LOC~~ SOAJ: 3.0000 mg | Freq: Once | SUBCUTANEOUS | 0 refills | Status: DC | PRN

## 2018-08-05 MED ORDER — TOPIRAMATE ER 50 MG PO CAP24: 50.0000 mg | ORAL_CAPSULE | Freq: Every day | ORAL | 6 refills | Status: DC

## 2018-08-05 NOTE — Patient Instructions (Addendum)
Start Aimovig Start Trokendi [facility] [place], MD Take Maxalt right at onset of migraine with ibuprofen or tylenol watch for medication overuse headache/rebound  Erenumab: Patient drug information [employer] Online here. Copyright [phone] [employer] All rights reserved. (For additional information see "Erenumab: Drug information") Brand Names: [place]  Aimovig  Brand Names: [place]  Aimovig  What is this drug used for?   It is used to prevent migraine headaches.  What do I need to tell my doctor BEFORE I take this drug?   If you have an allergy to this drug or any part of this drug.   If you are allergic to any drugs like this one, any other drugs, foods, or other substances. Tell your doctor about the allergy and what signs you had, like rash; hives; itching; shortness of breath; wheezing; cough; swelling of face, lips, tongue, or throat; or any other signs.   This drug may interact with other drugs or health problems.   Tell your doctor and pharmacist about all of your drugs (prescription or OTC, natural products, vitamins) and health problems. You must check to make sure that it is safe for you to take this drug with all of your drugs and health problems. Do not start, stop, or change the dose of any drug without checking with your doctor.  What are some things I need to know or do while I take this drug?   Tell all of your health care providers that you take this drug. This includes your doctors, nurses, pharmacists, and dentists.   If you have a latex allergy, talk with your doctor.   Tell your doctor if you are pregnant or plan on getting pregnant. You will need to talk about the benefits and risks of using this drug while you are pregnant.   Tell your doctor if you are breast-feeding. You will need to talk about any risks to your baby.  What are some side effects that I need to call my doctor about right away?   WARNING/CAUTION: Even though  it may be rare, some people may have very bad and sometimes deadly side effects when taking a drug. Tell your doctor or get medical help right away if you have any of the following signs or symptoms that may be related to a very bad side effect:   Signs of an allergic reaction, like rash; hives; itching; red, swollen, blistered, or peeling skin with or without fever; wheezing; tightness in the chest or throat; trouble breathing, swallowing, or talking; unusual hoarseness; or swelling of the mouth, face, lips, tongue, or throat.  What are some other side effects of this drug?   All drugs may cause side effects. However, many people have no side effects or only have minor side effects. Call your doctor or get medical help if any of these side effects or any other side effects bother you or do not go away:   Redness or swelling where the shot is given.   Pain where the shot was given.   Constipation.   These are not all of the side effects that may occur. If you have questions about side effects, call your doctor. Call your doctor for medical advice about side effects.   You may report side effects to your national health agency.  How is this drug best taken?   Use this drug as ordered by your doctor. Read all information given to you. Follow all instructions closely.   It is  given as a shot into the fatty part of the skin on the top of the thigh, belly area, or upper arm.   If you will be giving yourself the shot, your doctor or nurse will teach you how to give the shot.   Follow how to use as you have been told by the doctor or read the package insert.   If stored in a refrigerator, let this drug come to room temperature before using it. Leave it at room temperature for at least 30 minutes. Do not heat this drug.   Protect from heat and sunlight.   Do not shake.   Do not give into skin that is irritated, bruised, red, infected, or scarred.   Do not use if the solution is cloudy, leaking, or has  particles.   Do not use if solution changes color.   Throw away after using. Do not use the device more than 1 time.   Throw away needles in a needle/sharp disposal box. Do not reuse needles or other items. When the box is full, follow all local rules for getting rid of it. Talk with a doctor or pharmacist if you have any questions.  What do I do if I miss a dose?   Take a missed dose as soon as you think about it.   After taking a missed dose, start a new schedule based on when the dose is taken.  How do I store and/or throw out this drug?   Store in a refrigerator. Do not freeze.   Store in the carton to protect from light.   Do not use if it has been frozen.   If you drop this drug on a hard surface, do not use it.   If needed, you may store at room temperature for up to 7 days. Write down the date you take this drug out of the refrigerator. If stored at room temperature and not used within 7 days, throw this drug away.   Do not put this drug back in the refrigerator after it has been stored at room temperature.   Keep all drugs in a safe place. Keep all drugs out of the reach of children and pets.   Throw away unused or expired drugs. Do not flush down a toilet or pour down a drain unless you are told to do so. Check with your pharmacist if you have questions about the best way to throw out drugs. There may be drug take-back programs in your area.  General drug facts   If your symptoms or health problems do not get better or if they become worse, call your doctor.   Do not share your drugs with others and do not take anyone else's drugs.   Keep a list of all your drugs (prescription, natural products, vitamins, OTC) with you. Give this list to your doctor.   Talk with the doctor before starting any new drug, including prescription or OTC, natural products, or vitamins.   Some drugs may have another patient information leaflet. If you have any questions about this drug, please talk with  your doctor, nurse, pharmacist, or other health care provider.   If you think there has been an overdose, call your poison control center or get medical care right away. Be ready to tell or show what was taken, how much, and when it happened.    Topiramate extended-release capsules What is this medicine? TOPIRAMATE (toe PYRE a mate) is used to treat seizures in adults  or children with epilepsy. It is also used for the prevention of migraine headaches. This medicine may be used for other purposes; ask your health care provider or pharmacist if you have questions. COMMON BRAND NAME(S): Trokendi XR What should I tell my health care provider before I take this medicine? They need to know if you have any of these conditions: -cirrhosis of the liver or liver disease -diarrhea -glaucoma -kidney stones or kidney disease -lung disease like asthma, obstructive pulmonary disease, emphysema -metabolic acidosis -on a ketogenic diet -scheduled for surgery or a procedure -suicidal thoughts, plans, or attempt; a previous suicide attempt by you or a family member -an unusual or allergic reaction to topiramate, other medicines, foods, dyes, or preservatives -pregnant or trying to get pregnant -breast-feeding How should I use this medicine? Take this medicine by mouth with a glass of water. Follow the directions on the prescription label. Trokendi XR capsules must be swallowed whole. Do not sprinkle on food, break, crush, dissolve, or chew. Qudexy XR capsules may be swallowed whole or opened and sprinkled on a small amount of soft food. This mixture must be swallowed immediately. Do not chew or store mixture for later use. You may take this medicine with meals. Take your medicine at regular intervals. Do not take it more often than directed. Talk to your pediatrician regarding the use of this medicine in children. Special care may be needed. While Trokendi XR may be prescribed for children as young as 6 years  and Qudexy XR may be prescribed for children as young as 2 years for selected conditions, precautions do apply. Overdosage: If you think you have taken too much of this medicine contact a poison control center or emergency room at once. NOTE: This medicine is only for you. Do not share this medicine with others. What if I miss a dose? If you miss a dose, take it as soon as you can. If it is almost time for your next dose, take only that dose. Do not take double or extra doses. What may interact with this medicine? Do not take this medicine with any of the following medications: -probenecid This medicine may also interact with the following medications: -acetazolamide -alcohol -amitriptyline -birth control pills -digoxin -hydrochlorothiazide -lithium -medicines for pain, sleep, or muscle relaxation -metformin -methazolamide -other seizure or epilepsy medicines -pioglitazone -risperidone This list may not describe all possible interactions. Give your health care provider a list of all the medicines, herbs, non-prescription drugs, or dietary supplements you use. Also tell them if you smoke, drink alcohol, or use illegal drugs. Some items may interact with your medicine. What should I watch for while using this medicine? Visit your doctor or health care professional for regular checks on your progress. Do not stop taking this medicine suddenly. This increases the risk of seizures if you are using this medicine to control epilepsy. Wear a medical identification bracelet or chain to say you have epilepsy or seizures, and carry a card that lists all your medicines. This medicine can decrease sweating and increase your body temperature. Watch for signs of deceased sweating or fever, especially in children. Avoid extreme heat, hot baths, and saunas. Be careful about exercising, especially in hot weather. Contact your health care provider right away if you notice a fever or decrease in sweating. You  should drink plenty of fluids while taking this medicine. If you have had kidney stones in the past, this will help to reduce your chances of forming kidney stones. If you have  stomach pain, with nausea or vomiting and yellowing of your eyes or skin, call your doctor immediately. You may get drowsy, dizzy, or have blurred vision. Do not drive, use machinery, or do anything that needs mental alertness until you know how this medicine affects you. To reduce dizziness, do not sit or stand up quickly, especially if you are an older patient. Alcohol can increase drowsiness and dizziness. Avoid alcoholic drinks. Do not drink alcohol for 6 hours before or 6 hours after taking Trokendi XR. If you notice blurred vision, eye pain, or other eye problems, seek medical attention at once for an eye exam. The use of this medicine may increase the chance of suicidal thoughts or actions. Pay special attention to how you are responding while on this medicine. Any worsening of mood, or thoughts of suicide or dying should be reported to your health care professional right away. This medicine may increase the chance of developing metabolic acidosis. If left untreated, this can cause kidney stones, bone disease, or slowed growth in children. Symptoms include breathing fast, fatigue, loss of appetite, irregular heartbeat, or loss of consciousness. Call your doctor immediately if you experience any of these side effects. Also, tell your doctor about any surgery you plan on having while taking this medicine since this may increase your risk for metabolic acidosis. Birth control pills may not work properly while you are taking this medicine. Talk to your doctor about using an extra method of birth control. Women who become pregnant while using this medicine may enroll in the [place] Antiepileptic Drug Pregnancy Registry by calling [phone]. This registry collects information about the safety of antiepileptic drug use  during pregnancy. What side effects may I notice from receiving this medicine? Side effects that you should report to your doctor or health care professional as soon as possible: -allergic reactions like skin rash, itching or hives, swelling of the face, lips, or tongue -decreased sweating and/or rise in body temperature -depression -difficulty breathing, fast or irregular breathing patterns -difficulty speaking -difficulty walking or controlling muscle movements -hearing impairment -redness, blistering, peeling or loosening of the skin, including inside the mouth -tingling, pain or numbness in the hands or feet -unusually weak or tired -worsening of mood, thoughts or actions of suicide or dying Side effects that usually do not require medical attention (report to your doctor or health care professional if they continue or are bothersome): -altered taste -back pain, joint or muscle aches and pains -diarrhea, or constipation -headache -loss of appetite -nausea -stomach upset, indigestion -tremors This list may not describe all possible side effects. Call your doctor for medical advice about side effects. You may report side effects to FDA at 1-800-FDA-1088. Where should I keep my medicine? Keep out of the reach of children. Store at room temperature between 15 and 30 degrees C (59 and 86 degrees F) in a tightly closed container. Protect from moisture. Throw away any unused medicine after the expiration date. NOTE: This sheet is a summary. It may not cover all possible information. If you have questions about this medicine, talk to your doctor, pharmacist, or health care provider.  2018 Elsevier/Gold Standard (2016-02-22 12:33:11)

## 2018-08-05 NOTE — Progress Notes (Signed)
[facility]    Provider:  Dr Lucia Gaskins Referring Provider: Julaine Fusi, NP Primary Care Physician:  Julaine Fusi, NP  CC:  Migraine  HPI:  Sylvia Warren is a 37 y.o. female here as requested by Dr. Maryfrances Bunnell for headache.  Patient started having headaches at the age of 41 and she was diagnosed, she is never been to neurology or had an MRI.  Migraine starts above the left eye and radiates to the left occipital area, it is pounding and throbbing and pulsating with nausea, sensitivity to light, sensitivity to sound, dizziness, movement makes it worse, no significant vomiting.  She denies aura with the migraine no visual disturbance.  She is tried Tylenol and ibuprofen and Maxalt without significant relief. She has migraines 2x a month. She takes ibuprofen and maxalt. Usually they last one day, 3 severe or moderately migraine days. She has 20 headache days a month. At least 10 are migrainous. She may wake with headaches 6-7x a month and can be worse positionally with blurred vision, no snoring, (asked her to check with partner). Can last 24-72 hours. No aura.    Reviewed notes, labs and imaging from outside physicians, which showed:  meds tried: blood pressure med (lisinopril), zofran, maxalt, tylenol, flexeril,   Reviewed referring physician notes Ayesha Mohair.  She presented with a headache for greater than 4 days in August, no inciting events or acute head injury, pain as above the eye and radiates to the left occipital head, constant, throbbing and 7 out of 10, she does have a history of migraines but she is never had an MRI or been evaluated by neurology, migraine started at the age of 37, she currently reports nausea without vomiting, also sensitivity to light sound and dizziness.  She denies visual disturbance or migraine with aura, rest helps, she takes Tylenol ibuprofen and Maxalt without relief.  She is a continuous smoker.   MRI of the brain: reviewed images  which were normal  Review of Systems: Patient complains of symptoms per HPI as well as the following symptoms headache. Pertinent negatives and positives per HPI. All others negative.   Social History   Socioeconomic History  . Marital status: Married    Spouse name: Not on file  . Number of children: 3  . Years of education: Not on file  . Highest education level: Bachelor's degree (e.g., BA, AB, BS)  Occupational History  . Not on file  Social Needs  . Financial resource strain: Not on file  . Food insecurity:    Worry: Not on file    Inability: Not on file  . Transportation needs:    Medical: Not on file    Non-medical: Not on file  Tobacco Use  . Smoking status: Current Every Day Smoker    Packs/day: 1.00    Years: 18.00    Pack years: 18.00    Types: Cigarettes  . Smokeless tobacco: Never Used  Substance and Sexual Activity  . Alcohol use: No    Frequency: Never  . Drug use: No  . Sexual activity: Yes    Birth control/protection: None  Lifestyle  . Physical activity:    Days per week: Not on file    Minutes per session: Not on file  . Stress: Not on file  Relationships  . Social connections:    Talks on phone: Not on file    Gets together: Not on file    Attends religious service: Not on file  Active member of club or organization: Not on file    Attends meetings of clubs or organizations: Not on file    Relationship status: Not on file  . Intimate partner violence:    Fear of current or ex partner: Not on file    Emotionally abused: Not on file    Physically abused: Not on file    Forced sexual activity: Not on file  Other Topics Concern  . Not on file  Social History Narrative   Lives at home with her husband    Caffeine: 1 soda 5 days per week, coffee x 2 daily     Family History  Problem Relation Age of Onset  . Hypertension Mother   . Cirrhosis Mother   . Kidney disease Mother   . Healthy Sister   . Healthy Daughter   . Healthy Son   .  Cancer Maternal Aunt        vulvular  . Diabetes Paternal Grandmother   . Healthy Sister   . Healthy Sister   . Healthy Son     Past Medical History:  Diagnosis Date  . Hypertension     Past Surgical History:  Procedure Laterality Date  . CESAREAN SECTION    . KNEE ARTHROSCOPY    . TUBAL LIGATION      Current Outpatient Medications  Medication Sig Dispense Refill  . rizatriptan (MAXALT) 5 MG tablet Take 1 tablet (5 mg total) by mouth as needed for migraine. May repeat in 2 hours if needed 10 tablet 1  . Erenumab-aooe (AIMOVIG) 140 MG/ML SOAJ Inject 140 mg into the skin every 30 (thirty) days. 1 pen 11  . ibuprofen (ADVIL,MOTRIN) 200 MG tablet Take 400 mg by mouth every 6 (six) hours as needed.    Marland Kitchen lisinopril (PRINIVIL,ZESTRIL) 10 MG tablet Take 1 tablet (10 mg total) by mouth daily. 90 tablet 3  . SUMAtriptan Succinate (ZEMBRACE SYMTOUCH) 3 MG/0.5ML SOAJ Inject 3 mg into the skin once as needed for up to 1 dose. May repeat in 15 minutes. If symptoms persist, repeat in 2 hours. Max 4 injections daily. 1 pen 0  . Topiramate ER (TROKENDI XR) 50 MG CP24 Take 50 mg by mouth at bedtime. 30 capsule 6  . zolmitriptan (ZOMIG) 5 MG nasal solution Place 1 spray into the nose as needed for migraine. 1 Units 0   No current facility-administered medications for this visit.     Allergies as of 08/05/2018  . (No Known Allergies)    Vitals: BP 131/86 (BP Location: Right Arm, Patient Position: Sitting)   Pulse 79   Ht 5' 6.5" (1.689 m)   Wt 239 lb (108.4 kg)   LMP 07/07/2018 (Exact Date)   BMI 38.00 kg/m  Last Weight:  Wt Readings from Last 1 Encounters:  08/05/18 239 lb (108.4 kg)   Last Height:   Ht Readings from Last 1 Encounters:  08/05/18 5' 6.5" (1.689 m)  Physical exam: Exam: Gen: NAD, conversant, well nourised, obese, well groomed                     CV: RRR, no MRG. No Carotid Bruits. No peripheral edema, warm, nontender Eyes: Conjunctivae clear without exudates or  hemorrhage  Neuro: Detailed Neurologic Exam  Speech:    Speech is normal; fluent and spontaneous with normal comprehension.  Cognition:    The patient is oriented to person, place, and time;     recent and remote memory intact;  language fluent;     normal attention, concentration,     fund of knowledge Cranial Nerves:    The pupils are equal, round, and reactive to light. The fundi are normal and spontaneous venous pulsations are present. Visual fields are full to finger confrontation. Extraocular movements are intact. Trigeminal sensation is intact and the muscles of mastication are normal. The face is symmetric. The palate elevates in the midline. Hearing intact. Voice is normal. Shoulder shrug is normal. The tongue has normal motion without fasciculations.   Coordination:    Normal finger to nose and heel to shin. Normal rapid alternating movements.   Gait:    Heel-toe and tandem gait are normal.   Motor Observation:    No asymmetry, no atrophy, and no involuntary movements noted. Tone:    Normal muscle tone.    Posture:    Posture is normal. normal erect    Strength:    Strength is V/V in the upper and lower limbs.      Sensation: intact to LT     Reflex Exam:  DTR's:    Deep tendon reflexes in the upper and lower extremities are normal bilaterally.   Toes:    The toes are downgoing bilaterally.   Clonus:    Clonus is absent.        Assessment/Plan:  37 year old patient with chronic headaches likely chronic migraines  Start Aimovig Start Trokendi [facility] [place] Take Maxalt right at onset of migraine with ibuprofen or tylenol watch for medication overuse headache/rebound Provided samples of zomig nasal and zembrace for her to try   Orders Placed This Encounter  Procedures  . [facility]   Meds ordered this encounter  Medications  . Topiramate ER (TROKENDI XR) 50 MG CP24    Sig:  Take 50 mg by mouth at bedtime.    Dispense:  30 capsule    Refill:  6  . Erenumab-aooe (AIMOVIG) 140 MG/ML SOAJ    Sig: Inject 140 mg into the skin every 30 (thirty) days.    Dispense:  1 pen    Refill:  11    She has copay card even if insurance declines  . zolmitriptan (ZOMIG) 5 MG nasal solution    Sig: Place 1 spray into the nose as needed for migraine.    Dispense:  1 Units    Refill:  0  . SUMAtriptan Succinate (ZEMBRACE SYMTOUCH) 3 MG/0.5ML SOAJ    Sig: Inject 3 mg into the skin once as needed for up to 1 dose. May repeat in 15 minutes. If symptoms persist, repeat in 2 hours. Max 4 injections daily.    Dispense:  1 pen    Refill:  0      Naomie DeanAntonia Ahern, MD  [facility] [address] [place], [place] [id number]  Phone [phone] Fax [phone]

## 2018-08-06 ENCOUNTER — Telehealth: Payer: Self-pay

## 2018-08-06 NOTE — Telephone Encounter (Signed)
Pending approval for Aimovig 140 mg/mL Key: [id number]  Rx number: [id number] ICD 10 code: G43.901 [facility]

## 2018-08-09 NOTE — Telephone Encounter (Signed)
PA approved through [employer] for aimovig 140 mg.  Approved 08/05/2018-02/02/2019 Ref # [id number]

## 2018-08-11 ENCOUNTER — Telehealth: Payer: Self-pay | Admitting: *Deleted

## 2018-08-11 NOTE — Telephone Encounter (Signed)
Received notice from pharmacy that PA is required for the Trokendi XR. Called pt and discussed. Pt has a copay card for $0 copay which she will be taking along with the instructions to the pharmacy. Pt was advised to hold onto this for the year so it can be referred to later if needed. Pt verbalized appreciation and understanding.   PA completed on Cover My Meds. Key: [id number]. Anticipate determination from [employer] within 24 hours [phone].

## 2018-08-12 NOTE — Telephone Encounter (Signed)
Faxed additional questions from [employer]. Awaiting determination.

## 2018-08-19 NOTE — Telephone Encounter (Addendum)
Received denial letter from [employer]. Reason: pt required try step therapy, Topiramate IR first. Pt was already informed to take her copay card to the pharmacy for $0 copay.   PA number: [id number] Plan Code: [id number]  If we should choose to appeal call [phone]. Can also fax to [phone].

## 2018-08-31 ENCOUNTER — Encounter: Payer: Self-pay | Admitting: Family

## 2018-08-31 ENCOUNTER — Ambulatory Visit (INDEPENDENT_AMBULATORY_CARE_PROVIDER_SITE_OTHER): Payer: Self-pay | Admitting: Family

## 2018-08-31 VITALS — BP 132/90 | HR 70 | Temp 97.8°F | Wt 239.0 lb

## 2018-08-31 DIAGNOSIS — J069 Acute upper respiratory infection, unspecified: Secondary | ICD-10-CM

## 2018-08-31 MED ORDER — PREDNISONE 10 MG (21) PO TBPK: ORAL_TABLET | ORAL | 0 refills | Status: DC

## 2018-08-31 MED ORDER — PROMETHAZINE-PHENYLEPHRINE 6.25-5 MG/5ML PO SYRP: 5.0000 mL | ORAL_SOLUTION | Freq: Three times a day (TID) | ORAL | 0 refills | Status: DC | PRN

## 2018-08-31 NOTE — Patient Instructions (Signed)
Upper Respiratory Infection, Adult Most upper respiratory infections (URIs) are a viral infection of the air passages leading to the lungs. A URI affects the nose, throat, and upper air passages. The most common type of URI is nasopharyngitis and is typically referred to as "the common cold." URIs run their course and usually go away on their own. Most of the time, a URI does not require medical attention, but sometimes a bacterial infection in the upper airways can follow a viral infection. This is called a secondary infection. Sinus and middle ear infections are common types of secondary upper respiratory infections. Bacterial pneumonia can also complicate a URI. A URI can worsen asthma and chronic obstructive pulmonary disease (COPD). Sometimes, these complications can require emergency medical care and may be life threatening. What are the causes? Almost all URIs are caused by viruses. A virus is a type of germ and can spread from one person to another. What increases the risk? You may be at risk for a URI if:  You smoke.  You have chronic heart or lung disease.  You have a weakened defense (immune) system.  You are very young or very old.  You have nasal allergies or asthma.  You work in crowded or poorly ventilated areas.  You work in health care facilities or schools.  What are the signs or symptoms? Symptoms typically develop 2-3 days after you come in contact with a cold virus. Most viral URIs last 7-10 days. However, viral URIs from the influenza virus (flu virus) can last 14-18 days and are typically more severe. Symptoms may include:  Runny or stuffy (congested) nose.  Sneezing.  Cough.  Sore throat.  Headache.  Fatigue.  Fever.  Loss of appetite.  Pain in your forehead, behind your eyes, and over your cheekbones (sinus pain).  Muscle aches.  How is this diagnosed? Your health care provider may diagnose a URI by:  Physical exam.  Tests to check that your  symptoms are not due to another condition such as: ? Strep throat. ? Sinusitis. ? Pneumonia. ? Asthma.  How is this treated? A URI goes away on its own with time. It cannot be cured with medicines, but medicines may be prescribed or recommended to relieve symptoms. Medicines may help:  Reduce your fever.  Reduce your cough.  Relieve nasal congestion.  Follow these instructions at home:  Take medicines only as directed by your health care provider.  Gargle warm saltwater or take cough drops to comfort your throat as directed by your health care provider.  Use a warm mist humidifier or inhale steam from a shower to increase air moisture. This may make it easier to breathe.  Drink enough fluid to keep your urine clear or pale yellow.  Eat soups and other clear broths and maintain good nutrition.  Rest as needed.  Return to work when your temperature has returned to normal or as your health care provider advises. You may need to stay home longer to avoid infecting others. You can also use a face mask and careful hand washing to prevent spread of the virus.  Increase the usage of your inhaler if you have asthma.  Do not use any tobacco products, including cigarettes, chewing tobacco, or electronic cigarettes. If you need help quitting, ask your health care provider. How is this prevented? The best way to protect yourself from getting a cold is to practice good hygiene.  Avoid oral or hand contact with people with cold symptoms.  Wash your   hands often if contact occurs.  There is no clear evidence that vitamin C, vitamin E, echinacea, or exercise reduces the chance of developing a cold. However, it is always recommended to get plenty of rest, exercise, and practice good nutrition. Contact a health care provider if:  You are getting worse rather than better.  Your symptoms are not controlled by medicine.  You have chills.  You have worsening shortness of breath.  You have  brown or red mucus.  You have yellow or brown nasal discharge.  You have pain in your face, especially when you bend forward.  You have a fever.  You have swollen neck glands.  You have pain while swallowing.  You have white areas in the back of your throat. Get help right away if:  You have severe or persistent: ? Headache. ? Ear pain. ? Sinus pain. ? Chest pain.  You have chronic lung disease and any of the following: ? Wheezing. ? Prolonged cough. ? Coughing up blood. ? A change in your usual mucus.  You have a stiff neck.  You have changes in your: ? Vision. ? Hearing. ? Thinking. ? Mood. This information is not intended to replace advice given to you by your health care provider. Make sure you discuss any questions you have with your health care provider. Document Released: 04/29/2001 Document Revised: 07/06/2016 Document Reviewed: 02/08/2014 Elsevier Interactive Patient Education  2018 Elsevier Inc.  

## 2018-08-31 NOTE — Progress Notes (Signed)
Subjective:     Patient ID: Sylvia Warren, female   DOB: [birth year], 37 y.o.   MRN: [id number]  HPI 37 year old female is in today with c/o cough, congestion, sore throat and ear pain x 2 days. No fever or chills.   Review of Systems  Constitutional: Negative.   HENT: Positive for congestion and postnasal drip.   Respiratory: Positive for cough.   Cardiovascular: Negative.   Endocrine: Negative.   Musculoskeletal: Negative.   Skin: Negative.   Allergic/Immunologic: Negative.   Neurological: Negative.   Hematological: Negative.    Past Medical History:  Diagnosis Date  . Hypertension     Social History   Socioeconomic History  . Marital status: Married    Spouse name: Not on file  . Number of children: 3  . Years of education: Not on file  . Highest education level: Bachelor's degree (e.g., BA, AB, BS)  Occupational History  . Not on file  Social Needs  . Financial resource strain: Not on file  . Food insecurity:    Worry: Not on file    Inability: Not on file  . Transportation needs:    Medical: Not on file    Non-medical: Not on file  Tobacco Use  . Smoking status: Current Every Day Smoker    Packs/day: 1.00    Years: 18.00    Pack years: 18.00    Types: Cigarettes  . Smokeless tobacco: Never Used  Substance and Sexual Activity  . Alcohol use: No    Frequency: Never  . Drug use: No  . Sexual activity: Yes    Birth control/protection: None  Lifestyle  . Physical activity:    Days per week: Not on file    Minutes per session: Not on file  . Stress: Not on file  Relationships  . Social connections:    Talks on phone: Not on file    Gets together: Not on file    Attends religious service: Not on file    Active member of club or organization: Not on file    Attends meetings of clubs or organizations: Not on file    Relationship status: Not on file  . Intimate partner violence:    Fear of current or ex partner: Not on file    Emotionally abused:  Not on file    Physically abused: Not on file    Forced sexual activity: Not on file  Other Topics Concern  . Not on file  Social History Narrative   Lives at home with her husband    Caffeine: 1 soda 5 days per week, coffee x 2 daily     Past Surgical History:  Procedure Laterality Date  . CESAREAN SECTION    . KNEE ARTHROSCOPY    . TUBAL LIGATION      Family History  Problem Relation Age of Onset  . Hypertension Mother   . Cirrhosis Mother   . Kidney disease Mother   . Healthy Sister   . Healthy Daughter   . Healthy Son   . Cancer Maternal Aunt        vulvular  . Diabetes Paternal Grandmother   . Healthy Sister   . Healthy Sister   . Healthy Son     No Known Allergies  Current Outpatient Medications on File Prior to Visit  Medication Sig Dispense Refill  . Erenumab-aooe (AIMOVIG) 140 MG/ML SOAJ Inject 140 mg into the skin every 30 (thirty) days. 1 pen 11  .  ibuprofen (ADVIL,MOTRIN) 200 MG tablet Take 400 mg by mouth every 6 (six) hours as needed.    Marland Kitchen lisinopril (PRINIVIL,ZESTRIL) 10 MG tablet Take 1 tablet (10 mg total) by mouth daily. 90 tablet 3  . rizatriptan (MAXALT) 5 MG tablet Take 1 tablet (5 mg total) by mouth as needed for migraine. May repeat in 2 hours if needed 10 tablet 1  . Topiramate ER (TROKENDI XR) 50 MG CP24 Take 50 mg by mouth at bedtime. 30 capsule 6  . SUMAtriptan Succinate (ZEMBRACE SYMTOUCH) 3 MG/0.5ML SOAJ Inject 3 mg into the skin once as needed for up to 1 dose. May repeat in 15 minutes. If symptoms persist, repeat in 2 hours. Max 4 injections daily. 1 pen 0  . zolmitriptan (ZOMIG) 5 MG nasal solution Place 1 spray into the nose as needed for migraine. 1 Units 0   No current facility-administered medications on file prior to visit.     BP 132/90   Pulse 70   Temp 97.8 F (36.6 C)   Wt 239 lb (108.4 kg)   LMP 08/31/2018   SpO2 98%   BMI 38.00 kg/m chart    Objective:   Physical Exam  Constitutional: She is oriented to person,  place, and time. She appears well-developed and well-nourished.  HENT:  Ears bilaterally have fluid. Pharynx moderately red. No LA. Nasal turbinates are boggy and pale  Neck: Normal range of motion. Neck supple.  Cardiovascular: Normal rate, regular rhythm and normal heart sounds.  Pulmonary/Chest: Effort normal and breath sounds normal.  Neurological: She is alert and oriented to person, place, and time.  Skin: Skin is warm.       Assessment:     Sylvia Warren was seen today for focus-sorethroat ear pain(bil).  Diagnoses and all orders for this visit:  Viral upper respiratory tract infection  Other orders -     predniSONE (STERAPRED UNI-PAK 21 TAB) 10 MG (21) TBPK tablet; As directed -     promethazine-phenylephrine (PROMETHAZINE-PHENYLEPHRINE) 6.25-5 MG/5ML SYRP; Take 5 mLs by mouth every 8 (eight) hours as needed for congestion.      Plan:     Call the office with any questions or concerns

## 2018-09-01 ENCOUNTER — Telehealth: Payer: Self-pay | Admitting: Physician Assistant

## 2018-09-01 MED ORDER — PROMETHAZINE-PHENYLEPHRINE 6.25-5 MG/5ML PO SYRP: 5.0000 mL | ORAL_SOLUTION | Freq: Three times a day (TID) | ORAL | 0 refills | Status: AC | PRN

## 2018-09-01 NOTE — Telephone Encounter (Signed)
See telephone encounter 09/01/18 @ 3:21pm.  Patient presented to [facility] stating that [facility] does not have prescription cough syrup, prescribed at yesterday's visit. Gave patient printed script to take to pharmacy of her choice.  SFS PA-C.

## 2018-10-25 ENCOUNTER — Telehealth: Payer: Self-pay | Admitting: Adult Health

## 2018-10-25 NOTE — Progress Notes (Signed)
Subjective:    Patient ID: Sylvia Warren, female    DOB: 03/21/1981, 37 y.o.   MRN: [id number]  HPI:  Sylvia Warren presents with L sciatica pain that started 3 days ago, she denies acute injury/truama prior to onset of pain. She reports lumbar back pain started >7 years ago and will flare up every few months,. She received series of steroid injections  She denies acute trauma/injury prior to onset of this current exacerbation. She has upcoming appt with [facility] this Friday 10/29/18- she was advised to she her PCP for pain control until her appt She reports pain is constant, described as "constant cramp" and starts at top of L hip and radiated to top of L ankle. She states "I feel like I need to move all the time", however denies hx of RLS She reports all positions increase pain She has been using OTC Ibuprofen 600mg , last dose 0630- does not relieve pain She denies numbness/tingling in left toes She denies R sciatica  She denies bowel/bladder dysfunction  Patient Care Team    Relationship Specialty Notifications Start End  Julaine Fusianford,  D, NP PCP - General Family Medicine  12/16/17   [facility] [place]    12/16/17   [facility]  12/17/17     Patient Active Problem List   Diagnosis Date Noted  . Sciatica of left side 10/26/2018  . Other complicated headache syndrome 07/12/2018  . Great toe pain, left 06/16/2018  . RUQ pain 05/03/2018  . Vitamin D deficiency 02/10/2018  . Migraine with status migrainosus, not intractable 02/10/2018  . Elevated blood pressure reading 12/16/2017  . HTN, goal below 140/90 12/16/2017  . Tobacco use disorder 12/16/2017  . Healthcare maintenance 12/16/2017     Past Medical History:  Diagnosis Date  . Hypertension      Past Surgical History:  Procedure Laterality Date  . CESAREAN SECTION    . KNEE ARTHROSCOPY    . TUBAL LIGATION       Family History  Problem Relation Age of Onset  . Hypertension  Mother   . Cirrhosis Mother   . Kidney disease Mother   . Healthy Sister   . Healthy Daughter   . Healthy Son   . Cancer Maternal Aunt        vulvular  . Diabetes Paternal Grandmother   . Healthy Sister   . Healthy Sister   . Healthy Son      Social History   Substance and Sexual Activity  Drug Use No     Social History   Substance and Sexual Activity  Alcohol Use No  . Frequency: Never     Social History   Tobacco Use  Smoking Status Current Every Day Smoker  . Packs/day: 1.00  . Years: 18.00  . Pack years: 18.00  . Types: Cigarettes  Smokeless Tobacco Never Used     Outpatient Encounter Medications as of 10/26/2018  Medication Sig  . Erenumab-aooe (AIMOVIG) 140 MG/ML SOAJ Inject 140 mg into the skin every 30 (thirty) days.  Marland Kitchen. ibuprofen (ADVIL,MOTRIN) 200 MG tablet Take 400 mg by mouth every 6 (six) hours as needed.  Marland Kitchen. lisinopril (PRINIVIL,ZESTRIL) 10 MG tablet Take 1 tablet (10 mg total) by mouth daily.  . rizatriptan (MAXALT) 5 MG tablet Take 1 tablet (5 mg total) by mouth as needed for migraine. May repeat in 2 hours if needed  . Topiramate ER (TROKENDI XR) 50 MG CP24 Take 50 mg by mouth at  bedtime.  Marland Kitchen oxyCODONE-acetaminophen (PERCOCET/ROXICET) 5-325 MG tablet Take 1 tablet by mouth every 8 (eight) hours as needed for up to 3 days for severe pain.  . [DISCONTINUED] predniSONE (STERAPRED UNI-PAK 21 TAB) 10 MG (21) TBPK tablet As directed  . [DISCONTINUED] SUMAtriptan Succinate (ZEMBRACE SYMTOUCH) 3 MG/0.5ML SOAJ Inject 3 mg into the skin once as needed for up to 1 dose. May repeat in 15 minutes. If symptoms persist, repeat in 2 hours. Max 4 injections daily.  . [DISCONTINUED] zolmitriptan (ZOMIG) 5 MG nasal solution Place 1 spray into the nose as needed for migraine.  . [EXPIRED] methylPREDNISolone acetate (DEPO-MEDROL) injection 40 mg    No facility-administered encounter medications on file as of 10/26/2018.     Allergies: Patient has no known  allergies.  Body mass index is 37.76 kg/m.  Blood pressure 127/83, pulse 68, temperature 98 F (36.7 C), temperature source Oral, height 5' 6.5" (1.689 m), weight 237 lb 8 oz (107.7 kg), last menstrual period 10/01/2018, SpO2 100 %.  Review of Systems  Constitutional: Positive for activity change and fatigue. Negative for appetite change, chills, diaphoresis, fever and unexpected weight change.  Respiratory: Negative for cough, chest tightness, shortness of breath, wheezing and stridor.   Cardiovascular: Negative for chest pain, palpitations and leg swelling.  Musculoskeletal: Positive for arthralgias, back pain, gait problem and myalgias.  Hematological: Does not bruise/bleed easily.  Psychiatric/Behavioral: Positive for sleep disturbance.       Objective:   Physical Exam  Constitutional: She is oriented to person, place, and time. She appears well-developed and well-nourished. No distress.  HENT:  Head: Normocephalic and atraumatic.  Right Ear: External ear normal.  Left Ear: External ear normal.  Nose: Nose normal.  Mouth/Throat: Oropharynx is clear and moist.  Eyes: Pupils are equal, round, and reactive to light. Conjunctivae and EOM are normal.  Musculoskeletal: She exhibits tenderness. She exhibits no edema.       Right hip: Normal.       Left hip: She exhibits tenderness.       Left knee: Normal.  Lumbar Back- Limited flexion/extension Normal lateral rotation Positive Left leg raise Very guarded with all movement   Neurological: She is alert and oriented to person, place, and time.  Skin: Skin is warm and dry. Capillary refill takes less than 2 seconds. No rash noted. She is not diaphoretic. No erythema. No pallor.  Psychiatric: She has a normal mood and affect. Her behavior is normal. Judgment and thought content normal.      Assessment & Plan:   1. Sciatica of left side     Sciatica of left side [place] Controlled Substance Database reviewed- No  aberrancies noted Steroid injection given in clinic today. Please use Percocet for severe pain and OTC Ibuprofen for mild-moderate pain 800mg  every 8 hrs with food. Work excuse provided, okay to return Monday 11/01/18 per Orthopedic Recommendations. Please keep your appt with [facility] this Friday 10/29/18    FOLLOW-UP:  Return if symptoms worsen or fail to improve.

## 2018-10-25 NOTE — Telephone Encounter (Signed)
We have not seen the patient for this issue in the past.  Please authorize referral and indicated what kind of specialist you'd like her to see.  Sylvia Warren. Nelson, CMA

## 2018-10-25 NOTE — Telephone Encounter (Signed)
Patient was seen a few months ago in ED for lower back and leg pain. She was advised to f/u with specialist about this issue and is currently calling a few offices to see who can see her sooner. Patient has [facility] plan and needs a referral created just for documentation purposes and to show she did let her PCP know about this ongoing issue. Can we please get a generic referral created that I will update once patient lets us know who she will be seeing?

## 2018-10-26 ENCOUNTER — Encounter: Payer: Self-pay | Admitting: Adult Health

## 2018-10-26 ENCOUNTER — Ambulatory Visit (INDEPENDENT_AMBULATORY_CARE_PROVIDER_SITE_OTHER): Payer: No Typology Code available for payment source | Admitting: Adult Health

## 2018-10-26 VITALS — BP 127/83 | HR 68 | Temp 98.0°F | Ht 66.5 in | Wt 237.5 lb

## 2018-10-26 DIAGNOSIS — M5432 Sciatica, left side: Secondary | ICD-10-CM | POA: Insufficient documentation

## 2018-10-26 MED ORDER — OXYCODONE-ACETAMINOPHEN 5-325 MG PO TABS: 1.0000 | ORAL_TABLET | Freq: Three times a day (TID) | ORAL | 0 refills | Status: AC | PRN

## 2018-10-26 MED ORDER — METHYLPREDNISOLONE ACETATE 40 MG/ML IJ SUSP
40.0000 mg | Freq: Once | INTRAMUSCULAR | Status: AC
  Administered 2018-10-26: 40 mg via INTRAMUSCULAR

## 2018-10-26 NOTE — Assessment & Plan Note (Signed)
[place] Controlled Substance Database reviewed- No aberrancies noted Steroid injection given in clinic today. Please use Percocet for severe pain and OTC Ibuprofen for mild-moderate pain 800mg  every 8 hrs with food. Work excuse provided, okay to return Monday 11/01/18 per Orthopedic Recommendations. Please keep your appt with [facility] this Friday 10/29/18

## 2018-10-26 NOTE — Patient Instructions (Addendum)
Sciatica Sciatica is pain, numbness, weakness, or tingling along the path of the sciatic nerve. The sciatic nerve starts in the lower back and runs down the back of each leg. The nerve controls the muscles in the lower leg and in the back of the knee. It also provides feeling (sensation) to the back of the thigh, the lower leg, and the sole of the foot. Sciatica is a symptom of another medical condition that pinches or puts pressure on the sciatic nerve. Generally, sciatica only affects one side of the body. Sciatica usually goes away on its own or with treatment. In some cases, sciatica may keep coming back (recur). What are the causes? This condition is caused by pressure on the sciatic nerve, or pinching of the sciatic nerve. This may be the result of:  A disk in between the bones of the spine (vertebrae) bulging out too far (herniated disk).  Age-related changes in the spinal disks (degenerative disk disease).  A pain disorder that affects a muscle in the buttock (piriformis syndrome).  Extra bone growth (bone spur) near the sciatic nerve.  An injury or break (fracture) of the pelvis.  Pregnancy.  Tumor (rare). What increases the risk? The following factors may make you more likely to develop this condition:  Playing sports that place pressure or stress on the spine, such as football or weight lifting.  Having poor strength and flexibility.  A history of back injury.  A history of back surgery.  Sitting for long periods of time.  Doing activities that involve repetitive bending or lifting.  Obesity. What are the signs or symptoms? Symptoms can vary from mild to very severe, and they may include:  Any of these problems in the lower back, leg, hip, or buttock:  Mild tingling or dull aches.  Burning sensations.  Sharp pains.  Numbness in the back of the calf or the sole of the foot.  Leg weakness.  Severe back pain that makes movement difficult. These symptoms may  get worse when you cough, sneeze, or laugh, or when you sit or stand for long periods of time. Being overweight may also make symptoms worse. In some cases, symptoms may recur over time. How is this diagnosed? This condition may be diagnosed based on:  Your symptoms.  A physical exam. Your health care provider may ask you to do certain movements to check whether those movements trigger your symptoms.  You may have tests, including:  Blood tests.  X-rays.  MRI.  CT scan. How is this treated? In many cases, this condition improves on its own, without any treatment. However, treatment may include:  Reducing or modifying physical activity during periods of pain.  Exercising and stretching to strengthen your abdomen and improve the flexibility of your spine.  Icing and applying heat to the affected area.  Medicines that help:  To relieve pain and swelling.  To relax your muscles.  Injections of medicines that help to relieve pain, irritation, and inflammation around the sciatic nerve (steroids).  Surgery. Follow these instructions at home: Medicines   Take over-the-counter and prescription medicines only as told by your health care provider.  Do not drive or operate heavy machinery while taking prescription pain medicine. Managing pain   If directed, apply ice to the affected area.  Put ice in a plastic bag.  Place a towel between your skin and the bag.  Leave the ice on for 20 minutes, 2-3 times a day.  After icing, apply heat to the   heat to the affected area before you exercise or as often as told by your health care provider. Use the heat source that your health care provider recommends, such as a moist heat pack or a heating pad. ? Place a towel between your skin and the heat source. ? Leave the heat on for 20-30 minutes. ? Remove the heat if your skin turns bright red. This is especially important if you are unable to feel pain, heat, or cold. You may have a  greater risk of getting burned. Activity  Return to your normal activities as told by your health care provider. Ask your health care provider what activities are safe for you. ? Avoid activities that make your symptoms worse.  Take brief periods of rest throughout the day. Resting in a lying or standing position is usually better than sitting to rest. ? When you rest for longer periods, mix in some mild activity or stretching between periods of rest. This will help to prevent stiffness and pain. ? Avoid sitting for long periods of time without moving. Get up and move around at least one time each hour.  Exercise and stretch regularly, as told by your health care provider.  Do not lift anything that is heavier than 10 lb (4.5 kg) while you have symptoms of sciatica. When you do not have symptoms, you should still avoid heavy lifting, especially repetitive heavy lifting.  When you lift objects, always use proper lifting technique, which includes: ? Bending your knees. ? Keeping the load close to your body. ? Avoiding twisting. General instructions  Use good posture. ? Avoid leaning forward while sitting. ? Avoid hunching over while standing.  Maintain a healthy weight. Excess weight puts extra stress on your back and makes it difficult to maintain good posture.  Wear supportive, comfortable shoes. Avoid wearing high heels.  Avoid sleeping on a mattress that is too soft or too hard. A mattress that is firm enough to support your back when you sleep may help to reduce your pain.  Keep all follow-up visits as told by your health care provider. This is important. Contact a health care provider if:  You have pain that wakes you up when you are sleeping.  You have pain that gets worse when you lie down.  Your pain is worse than you have experienced in the past.  Your pain lasts longer than 4 weeks.  You experience unexplained weight loss. Get help right away if:  You lose control  of your bowel or bladder (incontinence).  You have: ? Weakness in your lower back, pelvis, buttocks, or legs that gets worse. ? Redness or swelling of your back. ? A burning sensation when you urinate. This information is not intended to replace advice given to you by your health care provider. Make sure you discuss any questions you have with your health care provider. Document Released: 10/28/2001 Document Revised: 04/08/2016 Document Reviewed: 07/13/2015 Elsevier Interactive Patient Education  2018 [employer]  Steroid injection given in clinic today. Please use Percocet for severe pain and OTC Ibuprofen for mild-moderate pain 800mg  every 8 hrs with food. Work excuse provided, okay to return Monday 11/01/18 per Orthopedic Recommendations. Please keep your appt with [facility] this Friday 10/29/18  Please call clinic with questions/concerns. FEEL BETTER!

## 2018-10-26 NOTE — Telephone Encounter (Signed)
She has OV today.

## 2018-11-03 ENCOUNTER — Encounter (INDEPENDENT_AMBULATORY_CARE_PROVIDER_SITE_OTHER): Payer: No Typology Code available for payment source

## 2018-11-16 ENCOUNTER — Other Ambulatory Visit: Payer: Self-pay

## 2018-11-16 ENCOUNTER — Ambulatory Visit (HOSPITAL_BASED_OUTPATIENT_CLINIC_OR_DEPARTMENT_OTHER): Payer: No Typology Code available for payment source | Admitting: Anesthesiology

## 2018-11-16 ENCOUNTER — Ambulatory Visit
Admission: RE | Admit: 2018-11-16 | Discharge: 2018-11-16 | Disposition: A | Discharge status: Home / Self Care | Payer: No Typology Code available for payment source | Source: Ambulatory Visit | Attending: Anesthesiology | Admitting: Anesthesiology

## 2018-11-16 ENCOUNTER — Encounter: Payer: Self-pay | Admitting: Anesthesiology

## 2018-11-16 ENCOUNTER — Telehealth: Payer: Self-pay | Admitting: Adult Health

## 2018-11-16 ENCOUNTER — Other Ambulatory Visit: Payer: Self-pay | Admitting: Anesthesiology

## 2018-11-16 VITALS — BP 156/101 | HR 64 | Temp 98.0°F | Resp 16 | Ht 67.0 in | Wt 237.0 lb

## 2018-11-16 DIAGNOSIS — M47817 Spondylosis without myelopathy or radiculopathy, lumbosacral region: Secondary | ICD-10-CM

## 2018-11-16 DIAGNOSIS — M5442 Lumbago with sciatica, left side: Secondary | ICD-10-CM

## 2018-11-16 DIAGNOSIS — R52 Pain, unspecified: Secondary | ICD-10-CM

## 2018-11-16 DIAGNOSIS — M5432 Sciatica, left side: Secondary | ICD-10-CM

## 2018-11-16 DIAGNOSIS — M5136 Other intervertebral disc degeneration, lumbar region: Secondary | ICD-10-CM

## 2018-11-16 MED ORDER — TRIAMCINOLONE ACETONIDE 40 MG/ML IJ SUSP
INTRAMUSCULAR | Status: AC
  Filled 2018-11-16: qty 1

## 2018-11-16 MED ORDER — TRIAMCINOLONE ACETONIDE 40 MG/ML IJ SUSP: 40.0000 mg | Freq: Once | INTRAMUSCULAR | Status: DC

## 2018-11-16 MED ORDER — IOPAMIDOL (ISOVUE-M 200) INJECTION 41%
INTRAMUSCULAR | Status: AC
  Filled 2018-11-16: qty 10

## 2018-11-16 MED ORDER — SODIUM CHLORIDE 0.9 % IV SOLN: 1000.0000 mL | INTRAVENOUS | Status: DC

## 2018-11-16 MED ORDER — LIDOCAINE HCL 2 % IJ SOLN
INTRAMUSCULAR | Status: AC
  Filled 2018-11-16: qty 20

## 2018-11-16 MED ORDER — LIDOCAINE HCL (PF) 1 % IJ SOLN: 10.0000 mL | Freq: Once | INTRAMUSCULAR | Status: DC

## 2018-11-16 MED ORDER — SODIUM CHLORIDE (PF) 0.9 % IJ SOLN
INTRAMUSCULAR | Status: AC
  Filled 2018-11-16: qty 10

## 2018-11-16 MED ORDER — ROPIVACAINE HCL 2 MG/ML IJ SOLN: 2.0000 mL/h | INTRAMUSCULAR | Status: DC

## 2018-11-16 MED ORDER — ROPIVACAINE HCL 2 MG/ML IJ SOLN
INTRAMUSCULAR | Status: AC
  Filled 2018-11-16: qty 10

## 2018-11-16 NOTE — Addendum Note (Signed)
Addended by: Stan HeadNELSON, Amyri Frenz S on: 11/16/2018 01:29 PM   Modules accepted: Orders

## 2018-11-16 NOTE — Telephone Encounter (Signed)
Patient is on Focus Plan with [facility] and needs a referral created/documented for upcoming OV with Dr. Gwenyth BenderJames Adams, pain [facility]. Can we create one for her please?

## 2018-11-16 NOTE — Progress Notes (Signed)
Safety precautions to be maintained throughout the outpatient stay will include: orient to surroundings, keep bed in low position, maintain call bell within reach at all times, provide assistance with transfer out of bed and ambulation.  

## 2018-11-16 NOTE — Patient Instructions (Signed)
____________________________________________________________________________________________  Post-Procedure Discharge Instructions  Instructions:  Apply ice: Fill a plastic sandwich bag with crushed ice. Cover it with a small towel and apply to injection site. Apply for 15 minutes then remove x 15 minutes. Repeat sequence on day of procedure, until you go to bed. The purpose is to minimize swelling and discomfort after procedure.  Apply heat: Apply heat to procedure site starting the day following the procedure. The purpose is to treat any soreness and discomfort from the procedure.  Food intake: Start with clear liquids (like water) and advance to regular food, as tolerated.   Physical activities: Keep activities to a minimum for the first 8 hours after the procedure.   Driving: If you have received any sedation, you are not allowed to drive for 24 hours after your procedure.  Blood thinner: Restart your blood thinner 6 hours after your procedure. (Only for those taking blood thinners)  Insulin: As soon as you can eat, you may resume your normal dosing schedule. (Only for those taking insulin)  Infection prevention: Keep procedure site clean and dry.  Post-procedure Pain Diary: Extremely important that this be done correctly and accurately. Recorded information will be used to determine the next step in treatment.  Pain evaluated is that of treated area only. Do not include pain from an untreated area.  Complete every hour, on the hour, for the initial 8 hours. Set an alarm to help you do this part accurately.  Do not go to sleep and have it completed later. It will not be accurate.  Follow-up appointment: Keep your follow-up appointment after the procedure. Usually 2 weeks for most procedures. (6 weeks in the case of radiofrequency.) Bring you pain diary.   Expect:  From numbing medicine (AKA: Local Anesthetics): Numbness or decrease in pain.  Onset: Full effect within 15  minutes of injected.  Duration: It will depend on the type of local anesthetic used. On the average, 1 to 8 hours.   From steroids: Decrease in swelling or inflammation. Once inflammation is improved, relief of the pain will follow.  Onset of benefits: Depends on the amount of swelling present. The more swelling, the longer it will take for the benefits to be seen. In some cases, up to 10 days.  Duration: Steroids will stay in the system x 2 weeks. Duration of benefits will depend on multiple posibilities including persistent irritating factors.  Occasional side-effects: Facial flushing (red, warm cheeks) , cramps (if present, drink Gatorade and take over-the-counter Magnesium 450-500 mg once to twice a day).  From procedure: Some discomfort is to be expected once the numbing medicine wears off. This should be minimal if ice and heat are applied as instructed.  Call if:  You experience numbness and weakness that gets worse with time, as opposed to wearing off.  New onset bowel or bladder incontinence. (This applies to Spinal procedures only)  Emergency Numbers:  Durning business hours (Monday - Thursday, 8:00 AM - 4:00 PM) (Friday, 9:00 AM - 12:00 Noon): [phone]  After hours: [phone] ____________________________________________________________________________________________  Epidural Steroid Injection Patient Information  Description: The epidural space surrounds the nerves as they exit the spinal cord.  In some patients, the nerves can be compressed and inflamed by a bulging disc or a tight spinal canal (spinal stenosis).  By injecting steroids into the epidural space, we can bring irritated nerves into direct contact with a potentially helpful medication.  These steroids act directly on the irritated nerves and can reduce swelling  and inflammation which often leads to decreased pain.  Epidural steroids may be injected anywhere along the spine and from the neck to the  low back depending upon the location of your pain.   After numbing the skin with local anesthetic (like Novocaine), a small needle is passed into the epidural space slowly.  You may experience a sensation of pressure while this is being done.  The entire block usually last less than 10 minutes.  Conditions which may be treated by epidural steroids:   Low back and leg pain  Neck and arm pain  Spinal stenosis  Post-laminectomy syndrome  Herpes zoster (shingles) pain  Pain from compression fractures  Preparation for the injection:  1. Do not eat any solid food or dairy products within 8 hours of your appointment.  2. You may drink clear liquids up to 3 hours before appointment.  Clear liquids include water, black coffee, juice or soda.  No milk or cream please. 3. You may take your regular medication, including pain medications, with a sip of water before your appointment  Diabetics should hold regular insulin (if taken separately) and take 1/2 normal NPH dos the morning of the procedure.  Carry some sugar containing items with you to your appointment. 4. A driver must accompany you and be prepared to drive you home after your procedure.  5. Bring all your current medications with your. 6. An IV may be inserted and sedation may be given at the discretion of the physician.   7. A blood pressure cuff, EKG and other monitors will often be applied during the procedure.  Some patients may need to have extra oxygen administered for a short period. 8. You will be asked to provide medical information, including your allergies, prior to the procedure.  We must know immediately if you are taking blood thinners (like Coumadin/Warfarin)  Or if you are allergic to IV iodine contrast (dye). We must know if you could possible be pregnant.  Possible side-effects:  Bleeding from needle site  Infection (rare, may require surgery)  Nerve injury (rare)  Numbness & tingling (temporary)  Difficulty  urinating (rare, temporary)  Spinal headache ( a headache worse with upright posture)  Light -headedness (temporary)  Pain at injection site (several days)  Decreased blood pressure (temporary)  Weakness in arm/leg (temporary)  Pressure sensation in back/neck (temporary)  Call if you experience:  Fever/chills associated with headache or increased back/neck pain.  Headache worsened by an upright position.  New onset weakness or numbness of an extremity below the injection site  Hives or difficulty breathing (go to the emergency room)  Inflammation or drainage at the infection site  Severe back/neck pain  Any new symptoms which are concerning to you  Please note:  Although the local anesthetic injected can often make your back or neck feel good for several hours after the injection, the pain will likely return.  It takes 3-7 days for steroids to work in the epidural space.  You may not notice any pain relief for at least that one week.  If effective, we will often do a series of three injections spaced 3-6 weeks apart to maximally decrease your pain.  After the initial series, we generally will wait several months before considering a repeat injection of the same type.  If you have any questions, please call [phone] [phone]

## 2018-11-16 NOTE — Progress Notes (Signed)
Subjective:  Patient ID: Sylvia Warren, female    DOB: 22-Oct-1981  Age: 37 y.o. MRN: [id number]  CC: Back Pain (lower)   Procedure: L5-S1 epidural steroid under fluoroscopic guidance onto L4-5 with no sedation  HPI MARSHELL RIEGER presents for evaluation.  She is a patient distantly known to me in the past with a history of degenerative disc disease and left side sciatica.  In the past she has had a series of epidural steroid injections for her low back pain several years ago.  She had near complete resolution of her pain for multiple years but has had recurrence of the same pain over the past few months.  She has gone through a course of oral steroids with minimal effect and physical therapy with minimal relief of her low back pain and left lower calf and foot numbness and tingling.  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  Nothing is really giving her much relief and she presents today via referral by her orthopedist scheduled for evaluation and an epidural steroid as requested.  She has completed her oral steroid course without significant improvement in her pain.  This gave her approximately 30% reduction bring her down from a pain score of 10 to a 7.  She denies any weakness in her legs or problems with her bowel or bladder function at this point.  Outpatient Medications Prior to Visit  Medication Sig Dispense Refill  . Erenumab-aooe (AIMOVIG) 140 MG/ML SOAJ Inject 140 mg into the skin every 30 (thirty) days. 1 pen 11  . ibuprofen (ADVIL,MOTRIN) 200 MG tablet Take 400 mg by mouth every 6 (six) hours as needed.    Marland Kitchen lisinopril (PRINIVIL,ZESTRIL) 10 MG tablet Take 1 tablet (10 mg total) by mouth daily. 90 tablet 3  . meloxicam (MOBIC) 15 MG tablet Take 15 mg by mouth daily.    . methocarbamol (ROBAXIN) 500 MG tablet Take 500 mg by mouth daily.    . rizatriptan (MAXALT) 5 MG tablet Take 1 tablet (5 mg total)  by mouth as needed for migraine. May repeat in 2 hours if needed 10 tablet 1  . Topiramate ER (TROKENDI XR) 50 MG CP24 Take 50 mg by mouth at bedtime. 30 capsule 6   No facility-administered medications prior to visit.     Review of Systems CNS: No confusion or sedation Cardiac: No angina or palpitations GI: No abdominal pain or constipation Constitutional: No nausea vomiting fevers or chills  Objective:  BP (!) 152/100   Pulse 63   Temp 98 F (36.7 C)   Resp 17   Ht 5\' 7"  (1.702 m)   Wt 237 lb (107.5 kg)   SpO2 97%   BMI 37.12 kg/m    BP Readings from Last 3 Encounters:  11/16/18 (!) 152/100  10/26/18 127/83  08/31/18 132/90     Wt Readings from Last 3 Encounters:  11/16/18 237 lb (107.5 kg)  10/26/18 237 lb 8 oz (107.7 kg)  08/31/18 239 lb (108.4 kg)     Physical Exam Pt is alert and oriented PERRL EOMI HEART IS RRR no murmur or rub LCTA no wheezing or rales MUSCULOSKELETAL reveals an antalgic gait and some obvious pain going from seated to standing.  She has a positive straight leg raise on the left side.  This is consistent with an L5 radiculitis.  She has some paraspinous muscle tenderness but no overt trigger points  are noted.  Labs  Lab Results  Component Value Date   HGBA1C 5.7 (H) 01/07/2018   Lab Results  Component Value Date   LDLCALC 124 (H) 01/07/2018   CREATININE 0.65 04/30/2018    -------------------------------------------------------------------------------------------------------------------- Lab Results  Component Value Date   WBC 13.3 (H) 04/30/2018   HGB 13.9 04/30/2018   HCT 40.6 04/30/2018   PLT 332 04/30/2018   GLUCOSE 104 (H) 04/30/2018   CHOL 183 01/07/2018   TRIG 122 01/07/2018   HDL 35 (L) 01/07/2018   LDLCALC 124 (H) 01/07/2018   ALT 17 04/30/2018   AST 17 04/30/2018   NA 135 04/30/2018   K 3.8 04/30/2018   CL 103 04/30/2018   CREATININE 0.65 04/30/2018   BUN 8 04/30/2018   CO2 23 04/30/2018   TSH 2.220  01/07/2018   HGBA1C 5.7 (H) 01/07/2018    --------------------------------------------------------------------------------------------------------------------- Mr Brain Wo Contrast  Result Date: 07/17/2018 CLINICAL DATA:  Complicated headache syndrome EXAM: MRI HEAD WITHOUT CONTRAST TECHNIQUE: Multiplanar, multiecho pulse sequences of the brain and surrounding structures were obtained without intravenous contrast. COMPARISON:  None. FINDINGS: Brain: No infarction, hemorrhage, hydrocephalus, extra-axial collection or mass lesion. Single tiny FLAIR hyperintensity in the left cerebral white matter, allowable for age. Vascular: Major flow voids are preserved Skull and upper cervical spine: No evidence of marrow lesion Sinuses/Orbits: Negative IMPRESSION: Negative exam.  No explanation for headache. Electronically Signed   By: Marnee SpringJonathon  Watts M.D.   On: 07/17/2018 12:43     Assessment & Plan:   Marlyne Beardsaanalea was seen today for back pain.  Diagnoses and all orders for this visit:  Sciatica of left side  DDD (degenerative disc disease), lumbar  Acute left-sided low back pain with left-sided sciatica  Lumbosacral spondylosis without myelopathy        ----------------------------------------------------------------------------------------------------------------------  Problem List Items Addressed This Visit      Unprioritized   Sciatica of left side - Primary   Relevant Medications   methocarbamol (ROBAXIN) 500 MG tablet    Other Visit Diagnoses    DDD (degenerative disc disease), lumbar       Relevant Medications   methocarbamol (ROBAXIN) 500 MG tablet   meloxicam (MOBIC) 15 MG tablet   Acute left-sided low back pain with left-sided sciatica       Relevant Medications   methocarbamol (ROBAXIN) 500 MG tablet   meloxicam (MOBIC) 15 MG tablet   Lumbosacral spondylosis without myelopathy       Relevant Medications   methocarbamol (ROBAXIN) 500 MG tablet   meloxicam (MOBIC) 15 MG  tablet        ----------------------------------------------------------------------------------------------------------------------  1. Sciatica of left side We will received with an epidural steroid today secondary to the severity of her pain.  She has been miserable for many weeks and desires to proceed today secondary to the persistent severity of her pain.  She has tried anti-inflammatories and physical therapy and an oral course of steroids without significant improvement in her symptom pathology.  We have gone over the risks and benefits of the procedure with her in full detail and all questions are answered.  I am scheduling her for return to clinic in 1 month for reevaluation possible repeat epidural injection at that time.  She is to continue with her physical therapy exercises with stretching and strengthening as described and discussed today.  2. DDD (degenerative disc disease), lumbar As above  3. Acute left-sided low back pain with left-sided sciatica As above  4. Lumbosacral spondylosis without myelopathy As  above    ----------------------------------------------------------------------------------------------------------------------  I am having Renda M. Duve maintain her ibuprofen, rizatriptan, lisinopril, Topiramate ER, Erenumab-aooe, methocarbamol, and meloxicam.   No orders of the defined types were placed in this encounter.  Patient's Medications  New Prescriptions   No medications on file  Previous Medications   ERENUMAB-AOOE (AIMOVIG) 140 MG/ML SOAJ    Inject 140 mg into the skin every 30 (thirty) days.   IBUPROFEN (ADVIL,MOTRIN) 200 MG TABLET    Take 400 mg by mouth every 6 (six) hours as needed.   LISINOPRIL (PRINIVIL,ZESTRIL) 10 MG TABLET    Take 1 tablet (10 mg total) by mouth daily.   MELOXICAM (MOBIC) 15 MG TABLET    Take 15 mg by mouth daily.   METHOCARBAMOL (ROBAXIN) 500 MG TABLET    Take 500 mg by mouth daily.   RIZATRIPTAN (MAXALT) 5 MG  TABLET    Take 1 tablet (5 mg total) by mouth as needed for migraine. May repeat in 2 hours if needed   TOPIRAMATE ER (TROKENDI XR) 50 MG CP24    Take 50 mg by mouth at bedtime.  Modified Medications   No medications on file  Discontinued Medications   No medications on file   ----------------------------------------------------------------------------------------------------------------------  Follow-up: Return for procedure, evaluation.   Procedure: L5-S1 with procession to L4-5 LESI with fluoroscopic guidance and no moderate sedation  NOTE: The risks, benefits, and expectations of the procedure have been discussed and explained to the patient who was understanding and in agreement with suggested treatment plan. No guarantees were made.  DESCRIPTION OF PROCEDURE: Lumbar epidural steroid injection with no IV Versed, EKG, blood pressure, pulse, and pulse oximetry monitoring. The procedure was performed with the patient in the prone position under fluoroscopic guidance.  Sterile prep x3 was initiated and I then injected subcutaneous lidocaine to the overlying L5-S1 site after its fluoroscopic identifictation.  Using strict aseptic technique, I then advanced an 18-gauge Tuohy epidural needle in the midline using interlaminar approach via loss-of-resistance to saline technique.  As the needle tip approximated the epidural space she was unable to tolerate the procedure secondary to discomfort.  I aborted at the L5-S1 interspace and proceeded to the L4-5 interspace.  1% lidocaine was injected subcu and the 18-gauge Touhy needle was advanced to a depth of approximately 8 cm with loss-of-resistance to saline and negative aspiration for heme or CSF there was negative aspiration for heme or  CSF.  I then confirmed position with both AP and Lateral fluoroscan.  2 cc of Isovue were injected and a  total of 5 mL of Preservative-Free normal saline mixed with 40 mg of Kenalog and 1cc Ropicaine 0.2 percent were  injected incrementally via the  epidurally placed needle. The needle was removed. The patient tolerated the injection well and was convalesced and discharged to home in stable condition. Should the patient have any post procedure difficulty they have been instructed on how to contact us for assistance.    Yevette EdwardsJames G Adams, MD

## 2018-11-22 ENCOUNTER — Encounter (INDEPENDENT_AMBULATORY_CARE_PROVIDER_SITE_OTHER): Payer: Self-pay | Admitting: Family Medicine

## 2018-11-22 ENCOUNTER — Ambulatory Visit (INDEPENDENT_AMBULATORY_CARE_PROVIDER_SITE_OTHER): Payer: No Typology Code available for payment source | Admitting: Family Medicine

## 2018-11-22 ENCOUNTER — Telehealth: Payer: Self-pay

## 2018-11-22 ENCOUNTER — Encounter: Payer: Self-pay | Admitting: Anesthesiology

## 2018-11-22 VITALS — BP 129/87 | HR 65 | Temp 97.9°F | Ht 66.0 in | Wt 235.0 lb

## 2018-11-22 DIAGNOSIS — Z9189 Other specified personal risk factors, not elsewhere classified: Secondary | ICD-10-CM | POA: Diagnosis not present

## 2018-11-22 DIAGNOSIS — Z6838 Body mass index (BMI) 38.0-38.9, adult: Secondary | ICD-10-CM

## 2018-11-22 DIAGNOSIS — Z0289 Encounter for other administrative examinations: Secondary | ICD-10-CM

## 2018-11-22 DIAGNOSIS — Z1331 Encounter for screening for depression: Secondary | ICD-10-CM

## 2018-11-22 DIAGNOSIS — I10 Essential (primary) hypertension: Secondary | ICD-10-CM | POA: Diagnosis not present

## 2018-11-22 DIAGNOSIS — R5383 Other fatigue: Secondary | ICD-10-CM

## 2018-11-22 DIAGNOSIS — E559 Vitamin D deficiency, unspecified: Secondary | ICD-10-CM | POA: Diagnosis not present

## 2018-11-22 DIAGNOSIS — R0602 Shortness of breath: Secondary | ICD-10-CM | POA: Diagnosis not present

## 2018-11-22 NOTE — Telephone Encounter (Signed)
Sylvia Warren, I spoke with Chip Boer about this, patient may be scheduled for private pay, she will need to fill out a waiver.

## 2018-11-22 NOTE — Telephone Encounter (Signed)
I called the patient to let her know the procedure she had done last week was denied authorization by her insurance. She has another procedure scheduled for 12/10/18. This will be denied as well, because the patients insurance requires PT, a recent MRI and explanation of nsaid therapy. She wants to keep the appt for the 24th, but we are not getting paid for these procedures that are denied by the insurance. I talked to her before the first procedure and let her know that it was not authorized and she was ok with that and stated she would pay out of pocket. Please let me know how to proceed with this. I am uncomfortable making the decision to allow another procedure to be done without insurance authorization.

## 2018-11-23 LAB — CBC WITH DIFFERENTIAL
Basophils Absolute: 0.1 10*3/uL (ref 0.0–0.2)
Basos: 1 %
EOS (ABSOLUTE): 0.2 10*3/uL (ref 0.0–0.4)
Eos: 1 %
Hematocrit: 42.1 % (ref 34.0–46.6)
Hemoglobin: 14.4 g/dL (ref 11.1–15.9)
Immature Grans (Abs): 0.1 10*3/uL (ref 0.0–0.1)
Immature Granulocytes: 1 %
Lymphocytes Absolute: 3.3 10*3/uL — ABNORMAL HIGH (ref 0.7–3.1)
Lymphs: 26 %
MCH: 29.9 pg (ref 26.6–33.0)
MCHC: 34.2 g/dL (ref 31.5–35.7)
MCV: 88 fL (ref 79–97)
MONOS ABS: 1 10*3/uL — AB (ref 0.1–0.9)
Monocytes: 8 %
Neutrophils Absolute: 8.2 10*3/uL — ABNORMAL HIGH (ref 1.4–7.0)
Neutrophils: 63 %
RBC: 4.81 x10E6/uL (ref 3.77–5.28)
RDW: 13.6 % (ref 11.7–15.4)
WBC: 12.8 10*3/uL — ABNORMAL HIGH (ref 3.4–10.8)

## 2018-11-23 LAB — INSULIN, RANDOM: INSULIN: 15.2 u[IU]/mL (ref 2.6–24.9)

## 2018-11-23 LAB — LIPID PANEL WITH LDL/HDL RATIO
Cholesterol, Total: 198 mg/dL (ref 100–199)
HDL: 58 mg/dL (ref >39)
LDL Calculated: 122 mg/dL — ABNORMAL HIGH (ref 0–99)
LDL/HDL RATIO: 2.1 ratio (ref 0.0–3.2)
Triglycerides: 90 mg/dL (ref 0–149)
VLDL Cholesterol Cal: 18 mg/dL (ref 5–40)

## 2018-11-23 LAB — HEMOGLOBIN A1C
Est. average glucose Bld gHb Est-mCnc: 120 mg/dL
Hgb A1c MFr Bld: 5.8 % — ABNORMAL HIGH (ref 4.8–5.6)

## 2018-11-23 LAB — COMPREHENSIVE METABOLIC PANEL
ALT: 18 IU/L (ref 0–32)
AST: 12 IU/L (ref 0–40)
Albumin/Globulin Ratio: 2.2 (ref 1.2–2.2)
Albumin: 4.6 g/dL (ref 3.5–5.5)
Alkaline Phosphatase: 77 IU/L (ref 39–117)
BILIRUBIN TOTAL: 0.5 mg/dL (ref 0.0–1.2)
BUN/Creatinine Ratio: 15 (ref 9–23)
BUN: 9 mg/dL (ref 6–20)
CO2: 20 mmol/L (ref 20–29)
Calcium: 9.6 mg/dL (ref 8.7–10.2)
Chloride: 101 mmol/L (ref 96–106)
Creatinine, Ser: 0.59 mg/dL (ref 0.57–1.00)
GFR calc non Af Amer: 116 mL/min/{1.73_m2} (ref >59)
GFR, EST AFRICAN AMERICAN: 134 mL/min/{1.73_m2} (ref >59)
Globulin, Total: 2.1 g/dL (ref 1.5–4.5)
Glucose: 73 mg/dL (ref 65–99)
Potassium: 4.5 mmol/L (ref 3.5–5.2)
Sodium: 136 mmol/L (ref 134–144)
TOTAL PROTEIN: 6.7 g/dL (ref 6.0–8.5)

## 2018-11-23 LAB — FOLATE: Folate: 2.9 ng/mL — ABNORMAL LOW (ref >3.0)

## 2018-11-23 LAB — T4, FREE: Free T4: 1.56 ng/dL (ref 0.82–1.77)

## 2018-11-23 LAB — T3: T3 TOTAL: 119 ng/dL (ref 71–180)

## 2018-11-23 LAB — VITAMIN D 25 HYDROXY (VIT D DEFICIENCY, FRACTURES): Vit D, 25-Hydroxy: 15.3 ng/mL — ABNORMAL LOW (ref 30.0–100.0)

## 2018-11-23 LAB — TSH: TSH: 1.3 u[IU]/mL (ref 0.450–4.500)

## 2018-11-23 LAB — VITAMIN B12: Vitamin B-12: 174 pg/mL — ABNORMAL LOW (ref 232–1245)

## 2018-11-23 NOTE — Telephone Encounter (Signed)
I called and left the patient a voicemail letting her know that was how we need to proceed. Thanks

## 2018-11-23 NOTE — Progress Notes (Signed)
Office: [phone]  /  Fax: [phone]   Dear Dr. Lucia Warren,   Thank you for referring Sylvia Warren to our clinic. The following note includes my evaluation and treatment recommendations.  HPI:   Chief Complaint: OBESITY    Sylvia Warren has been referred by Sylvia MaximAntonia B. Sylvia GaskinsAhern, MD  for consultation regarding her obesity and obesity related comorbidities.    Sylvia Warren (MR# [id number]) is a 38 y.o. female who presents on 11/22/2018 for obesity evaluation and treatment. Current BMI is Body mass index is 37.93 kg/m.Sylvia Warren has been struggling with her weight for many years and has been unsuccessful in either losing weight, maintaining weight loss, or reaching her healthy weight goal.     Sylvia Warren attended our information session and states she is currently in the action stage of change and ready to dedicate time achieving and maintaining a healthier weight. Sylvia Warren is interested in becoming our patient and working on intensive lifestyle modifications including (but not limited to) diet, exercise and weight loss.    Sylvia Warren states her family eats meals together she thinks her family will eat healthier with  her her desired weight loss is 60-75 lbs she has been heavy most of  her life she started gaining weight in elementary school her heaviest weight ever was 245 lbs she is a picky eater and doesn't like to eat healthier foods  she skips meals frequently she is frequently drinking liquids with calories she frequently makes poor food choices she frequently eats larger portions than normal  she struggles with emotional eating    Fatigue Sylvia Warren feels her energy is lower than it should be. This has worsened with weight gain and has not worsened recently. Sylvia Warren admits to daytime somnolence and  admits to waking up still tired. Patient is at risk for obstructive sleep apnea. Patent has a history of symptoms of daytime fatigue and morning headache. Patient generally  gets 6 hours of sleep per night, and states they generally have nightime awakenings. Snoring is present. Apneic episodes are not present. Epworth Sleepiness Score is 10.  Dyspnea on exertion Sylvia Warren notes increasing shortness of breath with exercising and seems to be worsening over time with weight gain. She notes getting out of breath sooner with activity than she used to. This has not gotten worse recently. EKG-Normal sinus rhythm. Anaisha denies orthopnea.  Hypertension Sylvia Warren is a 38 y.o. female with hypertension. Sylvia Warren has been on lisinopril for almost 1 year. Her blood pressure is controlled and she denies chest pain, chest pressure, or headaches. She is working weight loss to help control her blood pressure with the goal of decreasing her risk of heart attack and stroke.   Vitamin D Deficiency Sylvia Warren has a diagnosis of vitamin D deficiency. She is not on OTC Vit D supplementation, previously on once a week. She denies nausea, vomiting or muscle weakness.  Depression Screen Sylvia Warren (modified PHQ-9) score was  Depression screen PHQ 2/9 11/22/2018  Decreased Interest 2  Down, Depressed, Hopeless 1  PHQ - 2 Score 3  Altered sleeping 1  Tired, decreased energy 3  Change in appetite 1  Feeling bad or failure about yourself  0  Trouble concentrating 2  Moving slowly or fidgety/restless 0  Suicidal thoughts 0  PHQ-9 Score 10  Difficult doing work/chores Not difficult at all   At risk for diabetes Sylvia Warren is at higher than average risk for developing diabetes due to her obesity. She currently denies polyuria  or polydipsia.  ASSESSMENT AND PLAN:  Other fatigue - Plan: EKG 12-Lead, Vitamin B12, CBC With Differential, Folate, Hemoglobin A1c, Insulin, random, T3, T4, free, TSH, Comprehensive metabolic panel  Shortness of breath on exertion - Plan: Lipid Panel With LDL/HDL Ratio  Essential hypertension - Plan: Comprehensive metabolic panel  Vitamin D  deficiency - Plan: VITAMIN D 25 Hydroxy (Vit-D Deficiency, Fractures)  Depression screening  At risk for diabetes mellitus  Class 2 severe obesity with serious comorbidity and body mass index (BMI) of 38.0 to 38.9 in adult, unspecified obesity type (HCC)  PLAN:  Fatigue Sylvia Warren was informed that her fatigue may be related to obesity, depression or many other causes. Labs will be ordered, and in the meanwhile Rozelia has agreed to work on diet, exercise and weight loss to help with fatigue. Proper sleep hygiene was discussed including the need for 7-8 hours of quality sleep each night. A sleep study was not ordered based on symptoms and Epworth score.  Dyspnea on exertion Sylvia Warren shortness of breath appears to be obesity related and exercise induced. She has agreed to work on weight loss and gradually increase exercise to treat her exercise induced shortness of breath. If Yalexa follows our instructions and loses weight without improvement of her shortness of breath, we will plan to refer to pulmonology. We will monitor this condition regularly. Sylvia Warren agrees to this plan.  Hypertension We discussed sodium restriction, working on healthy weight loss, and a regular exercise program as the means to achieve improved blood pressure control. Sylvia Warren agreed with this plan and agreed to follow up as directed. We will continue to monitor her blood pressure as well as her progress with the above lifestyle modifications. She will continue her medications as prescribed and will watch for signs of hypotension as she continues her lifestyle modifications. EKG was done and we will check CMP and FLP today. Sylvia Warren agrees to follow up with our clinic in 2 weeks.  Vitamin D Deficiency Sylvia Warren was informed that low vitamin D levels contributes to fatigue and are associated with obesity, breast, and colon cancer. She will follow up for routine testing of vitamin D, at least 2-3 times per year. She was  informed of the risk of over-replacement of vitamin D and agrees to not increase her dose unless she discusses this with us first. We will check Vit D level today. Javonna agrees to follow up with our clinic in 2 weeks.  Depression Screen Jaydan had a moderately positive depression screening. Depression is commonly associated with obesity and often results in emotional eating behaviors. We will monitor this closely and work on CBT to help improve the non-hunger eating patterns. Referral to Psychology may be required if no improvement is seen as she continues in our clinic.  Diabetes risk counselling Clare was given extended (15 minutes) diabetes prevention counseling today. She is 38 y.o. female and has risk factors for diabetes including obesity. We discussed intensive lifestyle modifications today with an emphasis on weight loss as well as increasing exercise and decreasing simple carbohydrates in her diet.  Obesity Dalena is currently in the action stage of change and her goal is to continue with weight loss efforts. I recommend Alvin begin the structured treatment plan as follows:  She has agreed to follow the Category 2 plan + 100 calories with added 1 slice of bread to breakfast Blessings has been instructed to eventually work up to a goal of 150 minutes of combined cardio and strengthening exercise per week for  weight loss and overall health benefits. We discussed the following Behavioral Modification Strategies today: increasing lean protein intake, no skipping meals, and planning for success   She was informed of the importance of frequent follow up visits to maximize her success with intensive lifestyle modifications for her multiple health conditions. She was informed we would discuss her lab results at her next visit unless there is a critical issue that needs to be addressed sooner. Lexxie agreed to keep her next visit at the agreed upon time to discuss these  results.  ALLERGIES: No Known Allergies  MEDICATIONS: Current Outpatient Medications on File Prior to Visit  Medication Sig Dispense Refill  . Erenumab-aooe (AIMOVIG) 140 MG/ML SOAJ Inject 140 mg into the skin every 30 (thirty) days. 1 pen 11  . ibuprofen (ADVIL,MOTRIN) 200 MG tablet Take 400 mg by mouth every 6 (six) hours as needed.    Marland Kitchen lisinopril (PRINIVIL,ZESTRIL) 10 MG tablet Take 1 tablet (10 mg total) by mouth daily. 90 tablet 3  . meloxicam (MOBIC) 15 MG tablet Take 15 mg by mouth daily.    . methocarbamol (ROBAXIN) 500 MG tablet Take 500 mg by mouth daily.    . rizatriptan (MAXALT) 5 MG tablet Take 1 tablet (5 mg total) by mouth as needed for migraine. May repeat in 2 hours if needed 10 tablet 1  . Topiramate ER (TROKENDI XR) 50 MG CP24 Take 50 mg by mouth at bedtime. 30 capsule 6   No current facility-administered medications on file prior to visit.     PAST MEDICAL HISTORY: Past Medical History:  Diagnosis Date  . ADHD   . Hypertension   . Lumbar herniated disc   . Migraine   . Pain   . Sciatica     PAST SURGICAL HISTORY: Past Surgical History:  Procedure Laterality Date  . CESAREAN SECTION    . KNEE ARTHROSCOPY    . TUBAL LIGATION      SOCIAL HISTORY: Social History   Tobacco Use  . Smoking status: Current Every Day Smoker    Packs/day: 1.00    Years: 18.00    Pack years: 18.00    Types: Cigarettes  . Smokeless tobacco: Never Used  Substance Use Topics  . Alcohol use: No    Frequency: Never  . Drug use: No    FAMILY HISTORY: Family History  Problem Relation Age of Onset  . Hypertension Mother   . Cirrhosis Mother   . Kidney disease Mother   . Obesity Mother   . Healthy Sister   . Healthy Daughter   . Healthy Son   . Cancer Maternal Aunt        vulvular  . Diabetes Paternal Grandmother   . Healthy Sister   . Healthy Sister   . Healthy Son     ROS: Review of Systems  Constitutional: Positive for malaise/fatigue. Negative for weight  loss.  HENT:       + Dentures (partial)  Eyes:       + Wear glasses or contacts  Respiratory: Positive for cough and shortness of breath (with exertion).   Cardiovascular: Negative for chest pain and orthopnea.       Negative chest pressure  Gastrointestinal: Positive for heartburn. Negative for nausea and vomiting.  Genitourinary: Negative for frequency.  Musculoskeletal: Positive for back pain.       Negative muscle weakness  Neurological: Negative for headaches.  Endo/Heme/Allergies: Negative for polydipsia.    PHYSICAL EXAM: Blood pressure 129/87, pulse 65, temperature  97.9 F (36.6 C), temperature source Oral, height 5\' 6"  (1.676 m), weight 235 lb (106.6 kg), last menstrual period 11/12/2018, SpO2 98 %. Body mass index is 37.93 kg/m. Physical Exam Vitals signs reviewed.  Constitutional:      Appearance: Normal appearance. She is obese.  HENT:     Head: Normocephalic and atraumatic.     Nose: Nose normal.  Eyes:     General: No scleral icterus.    Extraocular Movements: Extraocular movements intact.  Neck:     Musculoskeletal: Normal range of motion and neck supple.  Cardiovascular:     Rate and Rhythm: Normal rate and regular rhythm.     Pulses: Normal pulses.  Pulmonary:     Effort: Pulmonary effort is normal. No respiratory distress.  Abdominal:     Palpations: Abdomen is soft.     Tenderness: There is no abdominal tenderness.     Comments: + Obesity  Musculoskeletal: Normal range of motion.     Right lower leg: No edema.     Left lower leg: No edema.  Skin:    General: Skin is warm and dry.  Neurological:     Mental Status: She is alert and oriented to person, place, and time.     Coordination: Coordination normal.  Psychiatric:        Warren and Affect: Warren normal.        Behavior: Behavior normal.     RECENT LABS AND TESTS: BMET    Component Value Date/Time   NA 136 11/22/2018 0000   K 4.5 11/22/2018 0000   CL 101 11/22/2018 0000   CO2 20  11/22/2018 0000   GLUCOSE 73 11/22/2018 0000   GLUCOSE 104 (H) 04/30/2018 0522   BUN 9 11/22/2018 0000   CREATININE 0.59 11/22/2018 0000   CALCIUM 9.6 11/22/2018 0000   GFRNONAA 116 11/22/2018 0000   GFRAA 134 11/22/2018 0000   Lab Results  Component Value Date   HGBA1C 5.8 (H) 11/22/2018   Lab Results  Component Value Date   INSULIN 15.2 11/22/2018   CBC    Component Value Date/Time   WBC 12.8 (H) 11/22/2018 0000   WBC 13.3 (H) 04/30/2018 0522   RBC 4.81 11/22/2018 0000   RBC 4.59 04/30/2018 0522   HGB 14.4 11/22/2018 0000   HCT 42.1 11/22/2018 0000   PLT 332 04/30/2018 0522   PLT 364 01/07/2018 0848   MCV 88 11/22/2018 0000   MCH 29.9 11/22/2018 0000   MCH 30.2 04/30/2018 0522   MCHC 34.2 11/22/2018 0000   MCHC 34.1 04/30/2018 0522   RDW 13.6 11/22/2018 0000   LYMPHSABS 3.3 (H) 11/22/2018 0000   MONOABS 1.3 (H) 04/30/2018 0522   EOSABS 0.2 11/22/2018 0000   BASOSABS 0.1 11/22/2018 0000   Iron/TIBC/Ferritin/ %Sat No results found for: IRON, TIBC, FERRITIN, IRONPCTSAT Lipid Panel     Component Value Date/Time   CHOL 198 11/22/2018 0000   TRIG 90 11/22/2018 0000   HDL 58 11/22/2018 0000   CHOLHDL 5.2 (H) 01/07/2018 0848   LDLCALC 122 (H) 11/22/2018 0000   Hepatic Function Panel     Component Value Date/Time   PROT 6.7 11/22/2018 0000   ALBUMIN 4.6 11/22/2018 0000   AST 12 11/22/2018 0000   ALT 18 11/22/2018 0000   ALKPHOS 77 11/22/2018 0000   BILITOT 0.5 11/22/2018 0000      Component Value Date/Time   TSH 1.300 11/22/2018 0000   TSH 2.220 01/07/2018 0848    ECG  shows NSR with a rate of 65 BPM INDIRECT CALORIMETER done today shows a VO2 of 317 and a REE of 2206.  Her calculated basal metabolic rate is 4315 thus her basal metabolic rate is better than expected.       OBESITY BEHAVIORAL INTERVENTION VISIT  Today's visit was # 1   Starting weight: 235 lbs Starting date: 11/22/2018 Today's weight : 235 lbs  Today's date: 11/22/2018 Total lbs  lost to date: 0    ASK: We discussed the diagnosis of obesity with Sylvia Loan today and Sylvia Beards agreed to give [place] permission to discuss obesity behavioral modification therapy today.  ASSESS: Shanigua has the diagnosis of obesity and her BMI today is 37.95 Rennae is in the action stage of change   ADVISE: Riannon was educated on the multiple health risks of obesity as well as the benefit of weight loss to improve her health. She was advised of the need for long term treatment and the importance of lifestyle modifications to improve her current health and to decrease her risk of future health problems.  AGREE: Multiple dietary modification options and treatment options were discussed and  Latrena agreed to follow the recommendations documented in the above note.  ARRANGE: Barbie was educated on the importance of frequent visits to treat obesity as outlined per CMS and USPSTF guidelines and agreed to schedule her next follow up appointment today.  I, Burt Knack, am acting as transcriptionist for Debbra Riding, MD   I have reviewed the above documentation for accuracy and completeness, and I agree with the above. - Debbra Riding, MD

## 2018-12-02 ENCOUNTER — Ambulatory Visit (INDEPENDENT_AMBULATORY_CARE_PROVIDER_SITE_OTHER): Payer: No Typology Code available for payment source | Admitting: Family Medicine

## 2018-12-02 ENCOUNTER — Encounter (INDEPENDENT_AMBULATORY_CARE_PROVIDER_SITE_OTHER): Payer: Self-pay | Admitting: Family Medicine

## 2018-12-02 VITALS — BP 126/92 | HR 59 | Temp 97.8°F | Ht 66.0 in | Wt 232.0 lb

## 2018-12-02 DIAGNOSIS — Z9189 Other specified personal risk factors, not elsewhere classified: Secondary | ICD-10-CM

## 2018-12-02 DIAGNOSIS — I10 Essential (primary) hypertension: Secondary | ICD-10-CM | POA: Diagnosis not present

## 2018-12-02 DIAGNOSIS — E559 Vitamin D deficiency, unspecified: Secondary | ICD-10-CM | POA: Diagnosis not present

## 2018-12-02 DIAGNOSIS — E538 Deficiency of other specified B group vitamins: Secondary | ICD-10-CM | POA: Diagnosis not present

## 2018-12-02 DIAGNOSIS — R7303 Prediabetes: Secondary | ICD-10-CM

## 2018-12-02 DIAGNOSIS — Z6837 Body mass index (BMI) 37.0-37.9, adult: Secondary | ICD-10-CM

## 2018-12-02 MED ORDER — PRENATAL 19 PO TABS: 1.0000 | ORAL_TABLET | Freq: Every day | ORAL | 0 refills | Status: DC

## 2018-12-02 MED ORDER — VITAMIN D (ERGOCALCIFEROL) 1.25 MG (50000 UNIT) PO CAPS: 50000.0000 [IU] | ORAL_CAPSULE | ORAL | 0 refills | Status: DC

## 2018-12-02 MED ORDER — METFORMIN HCL 500 MG PO TABS: 500.0000 mg | ORAL_TABLET | Freq: Every day | ORAL | 0 refills | Status: DC

## 2018-12-05 NOTE — Progress Notes (Signed)
Office: [phone]  /  Fax: [phone]   HPI:   Chief Complaint: OBESITY Sylvia Warren is here to discuss her progress with her obesity treatment plan. She is on the Category 2 plan + 100 calories with added 1 slice of bread at breakfast and is following her eating plan approximately 80 % of the time. She states she is exercising 0 minutes 0 times per week. [name] found following the plan to be harder this week secondary to starting menses. She is craving snacks this week. She is finding eating all the meat to be the hardest part.  Her weight is 232 lb (105.2 kg) today and has had a weight loss of 3 pounds over a period of 1 to 2 weeks since her last visit. She has lost 3 lbs since starting treatment with us.  Vitamin D Deficiency Brynli has a diagnosis of vitamin D deficiency. She is not on OTC Vit D supplementation and denies nausea, vomiting or muscle weakness.  Pre-Diabetes Sylvia Warren has a diagnosis of pre-diabetes based on her elevated Hgb A1c and was informed this puts her at greater risk of developing diabetes. She has had pre-diabetes for the last year and she is not on metformin. She notes carbohydrate cravings for potatoes and starches. She continues to work on diet and exercise to decrease risk of diabetes.   At risk for diabetes Marchella is at higher than average risk for developing diabetes due to her obesity and pre-diabetes. She currently denies polyuria or polydipsia.  Folate and Vit B12 Deficiency [name] has a diagnosis of folate and B12 insufficiency, but no megaloblastic anemia noted. She is not a vegetarian and does not have a previous diagnosis of pernicious anemia. She does not have a history of weight loss surgery.   Hypertension [name] is a 38 y.o. female with hypertension. Tere's blood pressure is controlled today. She denies chest pain, chest pressure, or headaches. She is working weight loss to help control her blood pressure with the goal of  decreasing her risk of heart attack and stroke.   ASSESSMENT AND PLAN:  Vitamin D deficiency - Plan: Vitamin D, Ergocalciferol, (DRISDOL) 1.25 MG (50000 UT) CAPS capsule  Prediabetes - Plan: metFORMIN (GLUCOPHAGE) 500 MG tablet  Folate deficiency - Plan: Prenatal Vit-DSS-Fe Fum-FA (PRENATAL 19) tablet  B12 nutritional deficiency - Plan: Prenatal Vit-DSS-Fe Fum-FA (PRENATAL 19) tablet  Essential hypertension  At risk for diabetes mellitus  Class 2 severe obesity with serious comorbidity and body mass index (BMI) of 37.0 to 37.9 in adult, unspecified obesity type (HCC)  PLAN:  Vitamin D Deficiency [name] was informed that low vitamin D levels contributes to fatigue and are associated with obesity, breast, and colon cancer. [name] agrees to start prescription Vit D @50 ,000 IU every week #4 with no refills. She will follow up for routine testing of vitamin D, at least 2-3 times per year. She was informed of the risk of over-replacement of vitamin D and agrees to not increase her dose unless she discusses this with us first. Sylvia Warren agrees to follow up with our clinic in 2 weeks.  Pre-Diabetes Sylvia Warren will continue to work on weight loss, exercise, and decreasing simple carbohydrates in her diet to help decrease the risk of diabetes. We dicussed metformin including benefits and risks. She was informed that eating too many simple carbohydrates or too many calories at one sitting increases the likelihood of GI side effects. [name] agrees to start metformin 500 mg PO q AM #30 with no refills.  [name] agrees to follow up with our clinic in 2 weeks as directed to monitor her progress.  Diabetes risk counselling [name] was given extended (15 minutes) diabetes prevention counseling today. She is 38 y.o. female and has risk factors for diabetes including obesity and pre-diabetes. We discussed intensive lifestyle modifications today with an emphasis on weight loss as well as increasing  exercise and decreasing simple carbohydrates in her diet.  Folate and Vit B12 Deficiency [name] will work on increasing B12 rich foods in her diet. Akita agrees to start prenatal vitamins 1 tablet PO daily #30 with no refills. [name] agrees to follow up with our clinic in 2 weeks.  Hypertension We discussed sodium restriction, working on healthy weight loss, and a regular exercise program as the means to achieve improved blood pressure control. [name] agreed with this plan and agreed to follow up as directed. We will continue to monitor her blood pressure as well as her progress with the above lifestyle modifications. [name] agrees to continue taking lisinopril and will watch for signs of hypotension as she continues her lifestyle modifications. Simon agrees to follow up with our clinic in 2 weeks.  Obesity [name] is currently in the action stage of change. As such, her goal is to continue with weight loss efforts She has agreed to follow the Category 2 plan + 100 calories with added 1 slice of bread at breakfast [name] has been instructed to work up to a goal of 150 minutes of combined cardio and strengthening exercise per week for weight loss and overall health benefits. We discussed the following Behavioral Modification Strategies today: increasing lean protein intake, increasing vegetables, work on meal planning and easy cooking plans, and planning for success   [name] has agreed to follow up with our clinic in 2 weeks. She was informed of the importance of frequent follow up visits to maximize her success with intensive lifestyle modifications for her multiple health conditions.  ALLERGIES: No Known Allergies  MEDICATIONS: Current Outpatient Medications on File Prior to Visit  Medication Sig Dispense Refill  . Erenumab-aooe (AIMOVIG) 140 MG/ML SOAJ Inject 140 mg into the skin every 30 (thirty) days. 1 pen 11  . ibuprofen (ADVIL,MOTRIN) 200 MG tablet Take 400 mg by  mouth every 6 (six) hours as needed.    Marland Kitchen lisinopril (PRINIVIL,ZESTRIL) 10 MG tablet Take 1 tablet (10 mg total) by mouth daily. 90 tablet 3  . meloxicam (MOBIC) 15 MG tablet Take 15 mg by mouth daily.    . methocarbamol (ROBAXIN) 500 MG tablet Take 500 mg by mouth daily.    . rizatriptan (MAXALT) 5 MG tablet Take 1 tablet (5 mg total) by mouth as needed for migraine. May repeat in 2 hours if needed 10 tablet 1  . Topiramate ER (TROKENDI XR) 50 MG CP24 Take 50 mg by mouth at bedtime. 30 capsule 6   No current facility-administered medications on file prior to visit.     PAST MEDICAL HISTORY: Past Medical History:  Diagnosis Date  . ADHD   . Hypertension   . Lumbar herniated disc   . Migraine   . Pain   . Sciatica     PAST SURGICAL HISTORY: Past Surgical History:  Procedure Laterality Date  . CESAREAN SECTION    . KNEE ARTHROSCOPY    . TUBAL LIGATION      SOCIAL HISTORY: Social History   Tobacco Use  . Smoking status: Current Every Day Smoker    Packs/day: 1.00    Years: 18.00  Pack years: 18.00    Types: Cigarettes  . Smokeless tobacco: Never Used  Substance Use Topics  . Alcohol use: No    Frequency: Never  . Drug use: No    FAMILY HISTORY: Family History  Problem Relation Age of Onset  . Hypertension Mother   . Cirrhosis Mother   . Kidney disease Mother   . Obesity Mother   . Healthy Sister   . Healthy Daughter   . Healthy Son   . Cancer Maternal Aunt        vulvular  . Diabetes Paternal Grandmother   . Healthy Sister   . Healthy Sister   . Healthy Son     ROS: Review of Systems  Constitutional: Positive for weight loss.  Cardiovascular: Negative for chest pain.       Negative chest pressure  Gastrointestinal: Negative for nausea and vomiting.  Genitourinary: Negative for frequency.  Musculoskeletal:       Negative muscle weakness  Neurological: Negative for headaches.  Endo/Heme/Allergies: Negative for polydipsia.    PHYSICAL  EXAM: Blood pressure (!) 126/92, pulse (!) 59, temperature 97.8 F (36.6 C), temperature source Oral, height 5\' 6"  (1.676 m), weight 232 lb (105.2 kg), last menstrual period 11/12/2018, SpO2 100 %. Body mass index is 37.45 kg/m. Physical Exam Vitals signs reviewed.  Constitutional:      Appearance: Normal appearance. She is obese.  Cardiovascular:     Rate and Rhythm: Normal rate.     Pulses: Normal pulses.  Pulmonary:     Effort: Pulmonary effort is normal.     Breath sounds: Normal breath sounds.  Musculoskeletal: Normal range of motion.  Skin:    General: Skin is warm and dry.  Neurological:     Mental Status: She is alert and oriented to person, place, and time.  Psychiatric:        Mood and Affect: Mood normal.        Behavior: Behavior normal.     RECENT LABS AND TESTS: BMET    Component Value Date/Time   NA 136 11/22/2018 0000   K 4.5 11/22/2018 0000   CL 101 11/22/2018 0000   CO2 20 11/22/2018 0000   GLUCOSE 73 11/22/2018 0000   GLUCOSE 104 (H) 04/30/2018 0522   BUN 9 11/22/2018 0000   CREATININE 0.59 11/22/2018 0000   CALCIUM 9.6 11/22/2018 0000   GFRNONAA 116 11/22/2018 0000   GFRAA 134 11/22/2018 0000   Lab Results  Component Value Date   HGBA1C 5.8 (H) 11/22/2018   HGBA1C 5.7 (H) 01/07/2018   Lab Results  Component Value Date   INSULIN 15.2 11/22/2018   CBC    Component Value Date/Time   WBC 12.8 (H) 11/22/2018 0000   WBC 13.3 (H) 04/30/2018 0522   RBC 4.81 11/22/2018 0000   RBC 4.59 04/30/2018 0522   HGB 14.4 11/22/2018 0000   HCT 42.1 11/22/2018 0000   PLT 332 04/30/2018 0522   PLT 364 01/07/2018 0848   MCV 88 11/22/2018 0000   MCH 29.9 11/22/2018 0000   MCH 30.2 04/30/2018 0522   MCHC 34.2 11/22/2018 0000   MCHC 34.1 04/30/2018 0522   RDW 13.6 11/22/2018 0000   LYMPHSABS 3.3 (H) 11/22/2018 0000   MONOABS 1.3 (H) 04/30/2018 0522   EOSABS 0.2 11/22/2018 0000   BASOSABS 0.1 11/22/2018 0000   Iron/TIBC/Ferritin/ %Sat No results  found for: IRON, TIBC, FERRITIN, IRONPCTSAT Lipid Panel     Component Value Date/Time   CHOL 198 11/22/2018 0000  TRIG 90 11/22/2018 0000   HDL 58 11/22/2018 0000   CHOLHDL 5.2 (H) 01/07/2018 0848   LDLCALC 122 (H) 11/22/2018 0000   Hepatic Function Panel     Component Value Date/Time   PROT 6.7 11/22/2018 0000   ALBUMIN 4.6 11/22/2018 0000   AST 12 11/22/2018 0000   ALT 18 11/22/2018 0000   ALKPHOS 77 11/22/2018 0000   BILITOT 0.5 11/22/2018 0000      Component Value Date/Time   TSH 1.300 11/22/2018 0000   TSH 2.220 01/07/2018 0848      OBESITY BEHAVIORAL INTERVENTION VISIT  Today's visit was # 2   Starting weight: 235 lbs Starting date: 11/22/2018 Today's weight : 232 lbs  Today's date: 12/02/2018 Total lbs lost to date: 3    ASK: We discussed the diagnosis of obesity with Gloriann Loan today and Sylvia Beards agreed to give [place] permission to discuss obesity behavioral modification therapy today.  ASSESS: Rickesha has the diagnosis of obesity and her BMI today is 37.46 Mithra is in the action stage of change   ADVISE: Ocia was educated on the multiple health risks of obesity as well as the benefit of weight loss to improve her health. She was advised of the need for long term treatment and the importance of lifestyle modifications to improve her current health and to decrease her risk of future health problems.  AGREE: Multiple dietary modification options and treatment options were discussed and  Lenya agreed to follow the recommendations documented in the above note.  ARRANGE: Arian was educated on the importance of frequent visits to treat obesity as outlined per CMS and USPSTF guidelines and agreed to schedule her next follow up appointment today.  I, Burt Knack, am acting as transcriptionist for Debbra Riding, MD  I have reviewed the above documentation for accuracy and completeness, and I agree with the above. - Debbra Riding,  MD

## 2018-12-06 ENCOUNTER — Ambulatory Visit (INDEPENDENT_AMBULATORY_CARE_PROVIDER_SITE_OTHER): Payer: No Typology Code available for payment source | Admitting: Family Medicine

## 2018-12-10 ENCOUNTER — Ambulatory Visit: Payer: No Typology Code available for payment source | Admitting: Anesthesiology

## 2018-12-22 ENCOUNTER — Encounter (INDEPENDENT_AMBULATORY_CARE_PROVIDER_SITE_OTHER): Payer: Self-pay

## 2018-12-22 ENCOUNTER — Ambulatory Visit (INDEPENDENT_AMBULATORY_CARE_PROVIDER_SITE_OTHER): Payer: No Typology Code available for payment source | Admitting: Family Medicine

## 2019-01-10 ENCOUNTER — Ambulatory Visit (INDEPENDENT_AMBULATORY_CARE_PROVIDER_SITE_OTHER): Payer: No Typology Code available for payment source | Admitting: Family Medicine

## 2019-01-10 ENCOUNTER — Encounter (INDEPENDENT_AMBULATORY_CARE_PROVIDER_SITE_OTHER): Payer: Self-pay | Admitting: Family Medicine

## 2019-01-10 VITALS — BP 139/88 | HR 79 | Temp 98.1°F | Ht 66.0 in | Wt 234.0 lb

## 2019-01-10 DIAGNOSIS — E559 Vitamin D deficiency, unspecified: Secondary | ICD-10-CM | POA: Diagnosis not present

## 2019-01-10 DIAGNOSIS — Z6837 Body mass index (BMI) 37.0-37.9, adult: Secondary | ICD-10-CM

## 2019-01-10 DIAGNOSIS — Z9189 Other specified personal risk factors, not elsewhere classified: Secondary | ICD-10-CM

## 2019-01-10 DIAGNOSIS — R7303 Prediabetes: Secondary | ICD-10-CM

## 2019-01-10 MED ORDER — VITAMIN D (ERGOCALCIFEROL) 1.25 MG (50000 UNIT) PO CAPS: 50000.0000 [IU] | ORAL_CAPSULE | ORAL | 0 refills | Status: DC

## 2019-01-10 MED ORDER — METFORMIN HCL 500 MG PO TABS: 500.0000 mg | ORAL_TABLET | Freq: Every day | ORAL | 0 refills | Status: DC

## 2019-01-11 NOTE — Progress Notes (Signed)
Office: [phone]  /  Fax: [phone]   HPI:   Chief Complaint: OBESITY Sylvia Warren is here to discuss her progress with her obesity treatment plan. She is on the Category 2 plan and is following her eating plan approximately 45% of the time. She states she is exercising 0 minutes 0 times per week. Sylvia Warren had some struggles following the plan over the past 5 weeks. She needed to reschedule her appointment as she had some familial stress with her father being worked up for cancer. She reports not having all the food in the house. Her weight is 234 lb (106.1 kg) today and has had a weight gain of 2 lbs since her last visit. She has lost 1 lbs since starting treatment with [place].  Vitamin D deficiency Sylvia Warren has a diagnosis of Vitamin D deficiency. She is currently taking prescription Vit D and denies nausea, vomiting or muscle weakness. She does report positive fatigue.  Pre-Diabetes Sylvia Warren has a diagnosis of prediabetes based on her elevated Hgb A1c of 5.8 and was informed this puts her at greater risk of developing diabetes. She is taking metformin currently and continues to work on diet and exercise to decrease risk of diabetes. She does report positive carb cravings.  At risk for diabetes Sylvia Warren is at higher than averagerisk for developing diabetes due to her obesity. She currently denies polyuria or polydipsia.  ASSESSMENT AND PLAN:  Vitamin D deficiency - Plan: Vitamin D, Ergocalciferol, (DRISDOL) 1.25 MG (50000 UT) CAPS capsule  Prediabetes - Plan: metFORMIN (GLUCOPHAGE) 500 MG tablet  At risk for diabetes mellitus  Class 2 severe obesity with serious comorbidity and body mass index (BMI) of 37.0 to 37.9 in adult, unspecified obesity type (HCC)  PLAN:  Vitamin D Deficiency Sylvia Warren was informed that low Vitamin D levels contributes to fatigue and are associated with obesity, breast, and colon cancer. She agrees to continue to take prescription Vit D @ 50,000 IU every  week #4 with no refills and will follow-up for routine testing of Vitamin D, at least 2-3 times per year. She was informed of the risk of over-replacement of Vitamin D and agrees to not increase her dose unless she discusses this with [place] first. Sylvia Warren agrees to follow-up with our clinic in 3 weeks.  Pre-Diabetes Sylvia Warren will continue to work on weight loss, exercise, and decreasing simple carbohydrates in her diet to help decrease the risk of diabetes. We dicussed metformin including benefits and risks. She was informed that eating too many simple carbohydrates or too many calories at one sitting increases the likelihood of GI side effects. Deshaun is on metformin for now and a prescription was written today for 500 mg PO qam #30 with no refills. Sylvia Warren agrees to follow-up with our clinic in 3 weeks.   Diabetes risk counseling Sylvia Warren was given extended (15 minutes) diabetes prevention counseling today. She is 38 y.o. female and has risk factors for diabetes including obesity. We discussed intensive lifestyle modifications today with an emphasis on weight loss as well as increasing exercise and decreasing simple carbohydrates in her diet.  Obesity Sylvia Warren is currently in the action stage of change. As such, her goal is to continue with weight loss efforts. She has agreed to follow the Category 2 plan + 100 calories. Sylvia Warren has been instructed to work up to a goal of 150 minutes of combined cardio and strengthening exercise per week for weight loss and overall health benefits. We discussed the following Behavioral Modification Strategies today: increasing lean  protein intake, increasing vegetables, work on meal planning and easy cooking plans, and planning for success. Sylvia Warren states she is going to go to the grocery store today.  Sylvia Warren has agreed to follow-up with our clinic in 3 weeks. She was informed of the importance of frequent follow up visits to maximize her success with  intensive lifestyle modifications for her multiple health conditions.  ALLERGIES: No Known Allergies  MEDICATIONS: Current Outpatient Medications on File Prior to Visit  Medication Sig Dispense Refill  . Erenumab-aooe (AIMOVIG) 140 MG/ML SOAJ Inject 140 mg into the skin every 30 (thirty) days. 1 pen 11  . ibuprofen (ADVIL,MOTRIN) 200 MG tablet Take 400 mg by mouth every 6 (six) hours as needed.    Marland Kitchen. lisinopril (PRINIVIL,ZESTRIL) 10 MG tablet Take 1 tablet (10 mg total) by mouth daily. 90 tablet 3  . meloxicam (MOBIC) 15 MG tablet Take 15 mg by mouth daily.    . methocarbamol (ROBAXIN) 500 MG tablet Take 500 mg by mouth daily.    . Prenatal Vit-DSS-Fe Fum-FA (PRENATAL 19) tablet Take 1 tablet by mouth daily. 30 tablet 0  . rizatriptan (MAXALT) 5 MG tablet Take 1 tablet (5 mg total) by mouth as needed for migraine. May repeat in 2 hours if needed 10 tablet 1  . Topiramate ER (TROKENDI XR) 50 MG CP24 Take 50 mg by mouth at bedtime. 30 capsule 6   No current facility-administered medications on file prior to visit.     PAST MEDICAL HISTORY: Past Medical History:  Diagnosis Date  . ADHD   . Hypertension   . Lumbar herniated disc   . Migraine   . Pain   . Sciatica     PAST SURGICAL HISTORY: Past Surgical History:  Procedure Laterality Date  . CESAREAN SECTION    . KNEE ARTHROSCOPY    . TUBAL LIGATION      SOCIAL HISTORY: Social History   Tobacco Use  . Smoking status: Current Every Day Smoker    Packs/day: 1.00    Years: 18.00    Pack years: 18.00    Types: Cigarettes  . Smokeless tobacco: Never Used  Substance Use Topics  . Alcohol use: No    Frequency: Never  . Drug use: No    FAMILY HISTORY: Family History  Problem Relation Age of Onset  . Hypertension Mother   . Cirrhosis Mother   . Kidney disease Mother   . Obesity Mother   . Healthy Sister   . Healthy Daughter   . Healthy Son   . Cancer Maternal Aunt        vulvular  . Diabetes Paternal Grandmother     . Healthy Sister   . Healthy Sister   . Healthy Son    ROS: Review of Systems  Constitutional: Positive for malaise/fatigue. Negative for weight loss.  Gastrointestinal: Negative for nausea and vomiting.  Musculoskeletal:       Negative for muscle weakness.  Endo/Heme/Allergies:       Negative for hypoglycemia.   PHYSICAL EXAM: Blood pressure 139/88, pulse 79, temperature 98.1 F (36.7 C), temperature source Oral, height 5\' 6"  (1.676 m), weight 234 lb (106.1 kg), SpO2 98 %. Body mass index is 37.77 kg/m. Physical Exam Vitals signs reviewed.  Constitutional:      Appearance: Normal appearance. She is obese.  Cardiovascular:     Rate and Rhythm: Normal rate.     Pulses: Normal pulses.  Pulmonary:     Effort: Pulmonary effort is normal.  Breath sounds: Normal breath sounds.  Musculoskeletal: Normal range of motion.  Skin:    General: Skin is warm and dry.  Neurological:     Mental Status: She is alert and oriented to person, place, and time.  Psychiatric:        Behavior: Behavior normal.   RECENT LABS AND TESTS: BMET    Component Value Date/Time   NA 136 11/22/2018 0000   K 4.5 11/22/2018 0000   CL 101 11/22/2018 0000   CO2 20 11/22/2018 0000   GLUCOSE 73 11/22/2018 0000   GLUCOSE 104 (H) 04/30/2018 0522   BUN 9 11/22/2018 0000   CREATININE 0.59 11/22/2018 0000   CALCIUM 9.6 11/22/2018 0000   GFRNONAA 116 11/22/2018 0000   GFRAA 134 11/22/2018 0000   Lab Results  Component Value Date   HGBA1C 5.8 (H) 11/22/2018   HGBA1C 5.7 (H) 01/07/2018   Lab Results  Component Value Date   INSULIN 15.2 11/22/2018   CBC    Component Value Date/Time   WBC 12.8 (H) 11/22/2018 0000   WBC 13.3 (H) 04/30/2018 0522   RBC 4.81 11/22/2018 0000   RBC 4.59 04/30/2018 0522   HGB 14.4 11/22/2018 0000   HCT 42.1 11/22/2018 0000   PLT 332 04/30/2018 0522   PLT 364 01/07/2018 0848   MCV 88 11/22/2018 0000   MCH 29.9 11/22/2018 0000   MCH 30.2 04/30/2018 0522   MCHC  34.2 11/22/2018 0000   MCHC 34.1 04/30/2018 0522   RDW 13.6 11/22/2018 0000   LYMPHSABS 3.3 (H) 11/22/2018 0000   MONOABS 1.3 (H) 04/30/2018 0522   EOSABS 0.2 11/22/2018 0000   BASOSABS 0.1 11/22/2018 0000   Iron/TIBC/Ferritin/ %Sat No results found for: IRON, TIBC, FERRITIN, IRONPCTSAT Lipid Panel     Component Value Date/Time   CHOL 198 11/22/2018 0000   TRIG 90 11/22/2018 0000   HDL 58 11/22/2018 0000   CHOLHDL 5.2 (H) 01/07/2018 0848   LDLCALC 122 (H) 11/22/2018 0000   Hepatic Function Panel     Component Value Date/Time   PROT 6.7 11/22/2018 0000   ALBUMIN 4.6 11/22/2018 0000   AST 12 11/22/2018 0000   ALT 18 11/22/2018 0000   ALKPHOS 77 11/22/2018 0000   BILITOT 0.5 11/22/2018 0000      Component Value Date/Time   TSH 1.300 11/22/2018 0000   TSH 2.220 01/07/2018 0848    Ref. Range 11/22/2018 00:00  Vitamin D, 25-Hydroxy Latest Ref Range: 30.0 - 100.0 ng/mL 15.3 (L)    OBESITY BEHAVIORAL INTERVENTION VISIT  Today's visit was #3   Starting weight: 235 lbs Starting date: 11/22/2018 Today's weight: 234 lbs Today's date: 01/10/2019 Total lbs lost to date: 1    01/10/2019  Height 5\' 6"  (1.676 m)  Weight 234 lb (106.1 kg)  BMI (Calculated) 37.79  BLOOD PRESSURE - SYSTOLIC 139  BLOOD PRESSURE - DIASTOLIC 88   Body Fat % 48.1 %  Total Body Water (lbs) 89.4 lbs   ASK: We discussed the diagnosis of obesity with Sylvia Warren today and Sylvia Warren agreed to give [place] permission to discuss obesity behavioral modification therapy today.  ASSESS: Sylvia Warren has the diagnosis of obesity and her BMI today is 37.79. Sylvia Warren is in the action stage of change.   ADVISE: Sylvia Warren was educated on the multiple health risks of obesity as well as the benefit of weight loss to improve her health. She was advised of the need for long term treatment and the importance of lifestyle modifications to  improve her current health and to decrease her risk of future health  problems.  AGREE: Multiple dietary modification options and treatment options were discussed and  Sylvia Warren agreed to follow the recommendations documented in the above note.  ARRANGE: Sylvia Warren was educated on the importance of frequent visits to treat obesity as outlined per CMS and USPSTF guidelines and agreed to schedule her next follow up appointment today.  I, Marianna Payment, am acting as transcriptionist for Debbra Riding, MD  I have reviewed the above documentation for accuracy and completeness, and I agree with the above. - Debbra Riding, MD

## 2019-01-24 ENCOUNTER — Encounter (INDEPENDENT_AMBULATORY_CARE_PROVIDER_SITE_OTHER): Payer: Self-pay | Admitting: Family Medicine

## 2019-01-25 ENCOUNTER — Telehealth: Payer: Self-pay | Admitting: *Deleted

## 2019-01-25 NOTE — Telephone Encounter (Signed)
Aimovig 140 mg PA in progress on Cover My Meds. KEY: [id number].   Called pt to see if she was having a positive response to Aimovig, decreased number of migraines, decreased severity. LVM asking for call back with this information. Left office number in message. When she calls back, please let me know what she says.

## 2019-01-26 NOTE — Telephone Encounter (Signed)
This key may have expired. I created another PA for Aimovig after I spoke with pt and received this message: Prior Authorization is not required for this medication dosage form and strength at the quantity and days supply requested. KEY: [id number].  Pt reports positive response to Aimovig. It is helping her migraines. She has been using the copay card.

## 2019-02-01 ENCOUNTER — Encounter (INDEPENDENT_AMBULATORY_CARE_PROVIDER_SITE_OTHER): Payer: Self-pay

## 2019-02-01 ENCOUNTER — Ambulatory Visit (INDEPENDENT_AMBULATORY_CARE_PROVIDER_SITE_OTHER): Payer: No Typology Code available for payment source | Admitting: Family Medicine

## 2019-02-03 ENCOUNTER — Encounter: Payer: Self-pay | Admitting: Adult Health

## 2019-02-03 ENCOUNTER — Other Ambulatory Visit: Payer: Self-pay

## 2019-02-03 ENCOUNTER — Ambulatory Visit (INDEPENDENT_AMBULATORY_CARE_PROVIDER_SITE_OTHER): Payer: No Typology Code available for payment source | Admitting: Adult Health

## 2019-02-03 ENCOUNTER — Telehealth: Payer: Self-pay | Admitting: Emergency Medicine

## 2019-02-03 VITALS — BP 141/96 | HR 80 | Temp 98.9°F | Ht 66.0 in | Wt 240.1 lb

## 2019-02-03 DIAGNOSIS — R509 Fever, unspecified: Secondary | ICD-10-CM | POA: Diagnosis not present

## 2019-02-03 DIAGNOSIS — R05 Cough: Secondary | ICD-10-CM | POA: Diagnosis not present

## 2019-02-03 DIAGNOSIS — R059 Cough, unspecified: Secondary | ICD-10-CM | POA: Insufficient documentation

## 2019-02-03 LAB — POCT INFLUENZA A/B
Influenza A, POC: NEGATIVE
Influenza B, POC: NEGATIVE

## 2019-02-03 LAB — POCT RAPID STREP A (OFFICE): Rapid Strep A Screen: NEGATIVE

## 2019-02-03 MED ORDER — CIPROFLOXACIN HCL 0.3 % OP SOLN: 1.0000 [drp] | OPHTHALMIC | 0 refills | Status: DC

## 2019-02-03 NOTE — Progress Notes (Signed)
Subjective:    Patient ID: Sylvia Warren, female    DOB: March 02, 1981, 38 y.o.   MRN: [id number]  HPI:  Sylvia Warren presents with productive cough (clear mucus), clear nasal drainage, fever (highest yesterday 100.9 f oral), and chills that all started 3 days ago. 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  Patient Care Team    Relationship Specialty Notifications Start End  DMina MarbleD, NP PCP - General Family Medicine  12/16/17   [facility]   12/16/17   [facility]  12/17/17     Patient Active Problem List   Diagnosis Date Noted  . Cough 02/03/2019  . Fever 02/03/2019  . Sciatica of left side 10/26/2018  . Other complicated headache syndrome 07/12/2018  . Great toe pain, left 06/16/2018  . RUQ pain 05/03/2018  . Vitamin D deficiency 02/10/2018  . Migraine with status migrainosus, not intractable 02/10/2018  . Elevated blood pressure reading 12/16/2017  . HTN, goal below 140/90 12/16/2017  . Tobacco use disorder 12/16/2017  . Healthcare maintenance 12/16/2017     Past Medical History:  Diagnosis Date  . ADHD   . Hypertension   . Lumbar herniated disc   . Migraine   . Pain   . Sciatica      Past Surgical History:  Procedure Laterality Date  . CESAREAN SECTION    . KNEE ARTHROSCOPY    . TUBAL LIGATION       Family History  Problem Relation Age of Onset  . Hypertension Mother   . Cirrhosis Mother   . Kidney disease Mother   . Obesity Mother   . Healthy Sister   . Healthy Daughter   .  Healthy Son   . Cancer Maternal Aunt        vulvular  . Diabetes Paternal Grandmother   . Healthy Sister   . Healthy Sister   . Healthy Son      Social History   Substance and Sexual Activity  Drug Use No     Social History   Substance and Sexual Activity  Alcohol Use No  . Frequency: Never     Social History   Tobacco Use  Smoking Status Current Every Day Smoker  . Packs/day: 1.00  . Years: 18.00  . Pack years: 18.00  . Types: Cigarettes  Smokeless Tobacco Never Used     Outpatient Encounter Medications as of 02/03/2019  Medication Sig  . Erenumab-aooe (AIMOVIG) 140 MG/ML SOAJ Inject 140 mg into the skin every 30 (thirty) days.  .Marland Kitchenibuprofen (ADVIL,MOTRIN) 200 MG tablet Take 400 mg by mouth every 6 (six) hours as needed.  .Marland Kitchenlisinopril (PRINIVIL,ZESTRIL) 10 MG tablet Take 1 tablet (10 mg total) by mouth daily.  . meloxicam (MOBIC) 15 MG tablet Take 15 mg by mouth daily.  . metFORMIN (GLUCOPHAGE) 500 MG tablet Take 1 tablet (500 mg total) by mouth daily with breakfast.  . methocarbamol (ROBAXIN) 500 MG tablet Take 500 mg by mouth daily.  . Prenatal Vit-DSS-Fe Fum-FA (PRENATAL 19) tablet Take 1 tablet by mouth daily.  .Marland Kitchen  rizatriptan (MAXALT) 5 MG tablet Take 1 tablet (5 mg total) by mouth as needed for migraine. May repeat in 2 hours if needed  . Topiramate ER (TROKENDI XR) 50 MG CP24 Take 50 mg by mouth at bedtime.  . Vitamin D, Ergocalciferol, (DRISDOL) 1.25 MG (50000 UT) CAPS capsule Take 1 capsule (50,000 Units total) by mouth every 7 (seven) days.   No facility-administered encounter medications on file as of 02/03/2019.     Allergies: Patient has no known allergies.  Body mass index is 38.75 kg/m.  Blood pressure (!) 141/96, pulse 80, temperature 98.9 F (37.2 C), temperature source Oral, height _0  (1.676 m), weight 240 lb 1.6 oz (108.9 kg), SpO2 97 %.  Review of Systems  Constitutional: Positive for activity change, appetite change, chills,  diaphoresis, fatigue and fever. Negative for unexpected weight change.  HENT: Positive for congestion, postnasal drip, rhinorrhea, sinus pain and sore throat. Negative for ear discharge, ear pain, trouble swallowing and voice change.   Respiratory: Positive for cough and chest tightness. Negative for shortness of breath and wheezing.   Cardiovascular: Negative for chest pain, palpitations and leg swelling.  Gastrointestinal: Negative for abdominal distention, abdominal pain, blood in stool, constipation, diarrhea, nausea and vomiting.  Endocrine: Negative for cold intolerance, heat intolerance, polydipsia, polyphagia and polyuria.  Genitourinary: Negative for difficulty urinating and flank pain.  Musculoskeletal: Positive for myalgias.  Neurological: Positive for headaches. Negative for dizziness.  Hematological: Does not bruise/bleed easily.       Objective:   Physical Exam Vitals signs and nursing note reviewed.  Constitutional:      Appearance: She is obese. She is ill-appearing and diaphoretic.  HENT:     Head: Normocephalic and atraumatic.     Right Ear: No decreased hearing noted. Tympanic membrane is bulging. Tympanic membrane is not erythematous.     Left Ear: No decreased hearing noted. Tympanic membrane is bulging. Tympanic membrane is not erythematous.     Nose: Congestion present.     Right Turbinates: Swollen.     Left Turbinates: Swollen.     Right Sinus: No maxillary sinus tenderness or frontal sinus tenderness.     Left Sinus: No maxillary sinus tenderness or frontal sinus tenderness.     Mouth/Throat:     Pharynx: Posterior oropharyngeal erythema present. No oropharyngeal exudate.     Tonsils: No tonsillar exudate. 1+ on the right. 1+ on the left.  Eyes:     General:        Right eye: No discharge.        Left eye: Discharge present.    Extraocular Movements: Extraocular movements intact.     Conjunctiva/sclera: Conjunctivae normal.     Pupils: Pupils are equal,  round, and reactive to light.     Comments: OS- eyelid swollen, clear drainage noted  Neck:     Musculoskeletal: Normal range of motion.  Cardiovascular:     Rate and Rhythm: Normal rate.     Pulses: Normal pulses.     Heart sounds: Normal heart sounds. No murmur. No friction rub. No gallop.   Pulmonary:     Effort: Pulmonary effort is normal. No respiratory distress.     Breath sounds: Normal breath sounds. No stridor. No wheezing, rhonchi or rales.  Chest:     Chest wall: No tenderness.  Lymphadenopathy:     Cervical: No cervical adenopathy.  Skin:    Capillary Refill: Capillary refill takes less than 2 seconds.     Comments: Very  to the touch   Neurological:     Mental Status: She is alert and oriented to person, place, and time.  Psychiatric:        Mood and Affect: Mood normal.        Behavior: Behavior normal.        Thought Content: Thought content normal.        Judgment: Judgment normal.       Assessment & Plan:   1. Fever, unspecified fever cause   2. Cough     Cough Since you had a fever yesterday and you currently have significant cough with negative Flu and Strep Test- you have met parameters for COVID-19 testing. Also since you work in [employer] it is imperative that we rule out if your have COVID-19 Please keep your mask on and proceed to nearest testing site. You will self quarentine until I call you with the results. Increase fluids rest, use OTC Acetaminophen of fever/discomfort. Either OTC Delsym or Robitussin as needed for cough.    FOLLOW-UP:  Return if symptoms worsen or fail to improve.

## 2019-02-03 NOTE — Patient Instructions (Signed)
Cough, Adult  A cough helps to clear your throat and lungs. A cough may last only 2-3 weeks (acute), or it may last longer than 8 weeks (chronic). Many different things can cause a cough. A cough may be a sign of an illness or another medical condition. Follow these instructions at home:  Pay attention to any changes in your cough.  Take medicines only as told by your doctor. ? If you were prescribed an antibiotic medicine, take it as told by your doctor. Do not stop taking it even if you start to feel better. ? Talk with your doctor before you try using a cough medicine.  Drink enough fluid to keep your pee (urine) clear or pale yellow.  If the air is dry, use a cold steam vaporizer or humidifier in your home.  Stay away from things that make you cough at work or at home.  If your cough is worse at night, try using extra pillows to raise your head up higher while you sleep.  Do not smoke, and try not to be around smoke. If you need help quitting, ask your doctor.  Do not have caffeine.  Do not drink alcohol.  Rest as needed. Contact a doctor if:  You have new problems (symptoms).  You cough up yellow fluid (pus).  Your cough does not get better after 2-3 weeks, or your cough gets worse.  Medicine does not help your cough and you are not sleeping well.  You have pain that gets worse or pain that is not helped with medicine.  You have a fever.  You are losing weight and you do not know why.  You have night sweats. Get help right away if:  You cough up blood.  You have trouble breathing.  Your heartbeat is very fast. This information is not intended to replace advice given to you by your health care provider. Make sure you discuss any questions you have with your health care provider. Document Released: 07/17/2011 Document Revised: 04/10/2016 Document Reviewed: 01/10/2015 Elsevier Interactive Patient Education  2019 [place] (COVID-19) Are you  at risk?  Are you at risk for the Coronavirus (COVID-19)?  To be considered HIGH RISK for Coronavirus (COVID-19), you have to meet the following criteria:  . Traveled to [place], [place], [place], [place] or [place]; or in the [place] to [place], [place], [place], or [place]; and have fever, cough, and shortness of breath within the last 2 weeks of travel OR . Been in close contact with a person diagnosed with COVID-19 within the last 2 weeks and have fever, cough, and shortness of breath . IF YOU DO NOT MEET THESE CRITERIA, YOU ARE CONSIDERED LOW RISK FOR COVID-19.  What to do if you are HIGH RISK for COVID-19?  Marland Kitchen If you are having a medical emergency, call 911. . Seek medical care right away. Before you go to a doctor's office, urgent care or emergency department, call ahead and tell them about your recent travel, contact with someone diagnosed with COVID-19, and your symptoms. You should receive instructions from your physician's office regarding next steps of care.  . When you arrive at healthcare provider, tell the healthcare staff immediately you have returned from visiting [place], [place], [place], [place] or [place]; or traveled in the [place] to [place], [place], [place], or [place]; in the last two weeks or you have been in close contact with a person diagnosed with COVID-19 in  the last 2 weeks.   . Tell the health care staff about your symptoms: fever, cough and shortness of breath. . After you have been seen by a medical provider, you will be either: o Tested for (COVID-19) and discharged home on quarantine except to seek medical care if symptoms worsen, and asked to  - Stay home and avoid contact with others until you get your results (4-5 days)  - Avoid travel on public transportation if possible (such as bus, train, or airplane) or o Sent to the Emergency Department by EMS for evaluation, COVID-19 testing, and possible admission depending on your condition  and test results.  What to do if you are LOW RISK for COVID-19?  Reduce your risk of any infection by using the same precautions used for avoiding the common cold or flu:  Marland Kitchen Wash your hands often with soap and warm water for at least 20 seconds.  If soap and water are not readily available, use an alcohol-based hand sanitizer with at least 60% alcohol.  . If coughing or sneezing, cover your mouth and nose by coughing or sneezing into the elbow areas of your shirt or coat, into a tissue or into your sleeve (not your hands). . Avoid shaking hands with others and consider head nods or verbal greetings only. . Avoid touching your eyes, nose, or mouth with unwashed hands.  . Avoid close contact with people who are sick. . Avoid places or events with large numbers of people in one location, like concerts or sporting events. . Carefully consider travel plans you have or are making. . If you are planning any travel outside or inside the [place], visit the CDC's Travelers' Health webpage for the latest health notices. . If you have some symptoms but not all symptoms, continue to monitor at home and seek medical attention if your symptoms worsen. . If you are having a medical emergency, call 911.   [place] / e-Visit: [email or web address]         [facility]: [phone]  [facility]: [phone]                   [facility]: [phone]   Since you had a fever yesterday and you currently have significant cough with negative Flu and Strep Test- you have met parameters for COVID-19 testing. Also since you work in [employer] it is imperative that we rule out if your have COVID-19 Please keep your mask on and proceed to nearest testing site. You will self quarentine until I call you with the results. Increase fluids rest, use OTC Acetaminophen of  fever/discomfort. Either OTC Delsym or Robitussin as needed for cough. Reduce to stop tobacco use- YOU CAN DO IT!

## 2019-02-03 NOTE — Assessment & Plan Note (Signed)
Since you had a fever yesterday and you currently have significant cough with negative Flu and Strep Test- you have met parameters for COVID-19 testing. Also since you work in [employer] it is imperative that we rule out if your have COVID-19 Please keep your mask on and proceed to nearest testing site. You will self quarentine until I call you with the results. Increase fluids rest, use OTC Acetaminophen of fever/discomfort. Either OTC Delsym or Robitussin as needed for cough.

## 2019-02-04 ENCOUNTER — Encounter (INDEPENDENT_AMBULATORY_CARE_PROVIDER_SITE_OTHER): Payer: Self-pay | Admitting: Family Medicine

## 2019-02-07 ENCOUNTER — Telehealth: Payer: Self-pay

## 2019-02-07 ENCOUNTER — Encounter: Payer: Self-pay | Admitting: Adult Health

## 2019-02-07 NOTE — Telephone Encounter (Signed)
Advised pt that COVID-19 test is still pending.  Pt states that she is feeling a bit better but still has a bad cough.  Pt states that she is has not checked her temperature and is continuing to self-isolate.  Advised pt that we will call her when results are finalized.  Pt expressed understanding.  Tiajuana Amass, CMA

## 2019-02-10 ENCOUNTER — Encounter (INDEPENDENT_AMBULATORY_CARE_PROVIDER_SITE_OTHER): Payer: Self-pay

## 2019-02-18 LAB — NOVEL CORONAVIRUS, NAA: SARS-CoV-2, NAA: NOT DETECTED

## 2019-03-14 ENCOUNTER — Encounter: Payer: Self-pay | Admitting: Adult Health

## 2019-05-10 NOTE — Progress Notes (Signed)
Subjective:    Patient ID: Sylvia Warren, female    DOB: 08/25/1981, 38 y.o.   MRN: [id number]  HPI:  Sylvia Warren presents for CPE She continues to smoke- 1/2 pack/day. She has not tried the Nicoderm patches- encouraged to at least try patch for 2 weeks. She estimates to drink 50 oz/day and 5 "large cups of coffee" per day- she denies insomnia. She has not been following the eating plan as drafted by [facility]. She denies regular exercise. She reports only taking 2 dose of Metformin-we discussed the benefits of this rx- lowering blood glucose and weigh loss. She reports HAs are well controlled on Topiramate ER 50mg - denies paresthesias   Lab Results  Component Value Date   HGBA1C 5.7 (A) 05/12/2019   HGBA1C 5.8 (H) 11/22/2018   HGBA1C 5.7 (H) 01/07/2018   02/03/2019 COVID test- negative JUST TURNED 38  Healthcare Maintenance: PAP-UTD, 11/03/2016, last normal Mammogram-ordered Immunizations-UTD   Patient Care Team    Relationship Specialty Notifications Start End  Julaine Fusianford, Katy D, NP PCP - General Family Medicine  12/16/17   [facility] [place]    12/16/17   [facility]  Orthopedic Surgery  12/17/17     Patient Active Problem List   Diagnosis Date Noted  . BMI 40.0-44.9, adult (HCC) 05/12/2019  . Cough 02/03/2019  . Fever 02/03/2019  . Sciatica of left side 10/26/2018  . Other complicated headache syndrome 07/12/2018  . Great toe pain, left 06/16/2018  . RUQ pain 05/03/2018  . Vitamin D deficiency 02/10/2018  . Migraine with status migrainosus, not intractable 02/10/2018  . HTN, goal below 130/80 12/16/2017  . Tobacco use disorder 12/16/2017  . Healthcare maintenance 12/16/2017     Past Medical History:  Diagnosis Date  . ADHD   . Hypertension   . Lumbar herniated disc   . Migraine   . Pain   . Sciatica      Past Surgical History:  Procedure Laterality Date  . CESAREAN SECTION    . KNEE ARTHROSCOPY    . TUBAL LIGATION        Family History  Problem Relation Age of Onset  . Hypertension Mother   . Cirrhosis Mother   . Kidney disease Mother   . Obesity Mother   . Healthy Sister   . Healthy Daughter   . Healthy Son   . Cancer Maternal Aunt        vulvular  . Diabetes Paternal Grandmother   . Healthy Sister   . Healthy Sister   . Healthy Son      Social History   Substance and Sexual Activity  Drug Use No     Social History   Substance and Sexual Activity  Alcohol Use No  . Frequency: Never     Social History   Tobacco Use  Smoking Status Current Every Day Smoker  . Packs/day: 1.00  . Years: 18.00  . Pack years: 18.00  . Types: Cigarettes  Smokeless Tobacco Never Used     Outpatient Encounter Medications as of 05/12/2019  Medication Sig  . ibuprofen (ADVIL,MOTRIN) 200 MG tablet Take 400 mg by mouth every 6 (six) hours as needed.  Marland Kitchen. lisinopril (ZESTRIL) 10 MG tablet Take 1 tablet (10 mg total) by mouth daily.  . rizatriptan (MAXALT) 5 MG tablet Take 1 tablet (5 mg total) by mouth as needed for migraine. May repeat in 2 hours if needed  . Topiramate ER (TROKENDI XR) 50 MG CP24 Take  50 mg by mouth at bedtime.  . [DISCONTINUED] lisinopril (PRINIVIL,ZESTRIL) 10 MG tablet Take 1 tablet (10 mg total) by mouth daily.  . [DISCONTINUED] ciprofloxacin (CILOXAN) 0.3 % ophthalmic solution Place 1 drop into the left eye every 2 (two) hours. Administer 1 drop, every 2 hours, while awake, for 2 days. Then 1 drop, every 4 hours, while awake, for the next 5 days.  . [DISCONTINUED] Erenumab-aooe (AIMOVIG) 140 MG/ML SOAJ Inject 140 mg into the skin every 30 (thirty) days.  . [DISCONTINUED] meloxicam (MOBIC) 15 MG tablet Take 15 mg by mouth daily.  . [DISCONTINUED] metFORMIN (GLUCOPHAGE) 500 MG tablet Take 1 tablet (500 mg total) by mouth daily with breakfast.  . [DISCONTINUED] methocarbamol (ROBAXIN) 500 MG tablet Take 500 mg by mouth daily.  . [DISCONTINUED] Prenatal Vit-DSS-Fe Fum-FA (PRENATAL  19) tablet Take 1 tablet by mouth daily.  . [DISCONTINUED] Vitamin D, Ergocalciferol, (DRISDOL) 1.25 MG (50000 UT) CAPS capsule Take 1 capsule (50,000 Units total) by mouth every 7 (seven) days.   No facility-administered encounter medications on file as of 05/12/2019.     Allergies: Patient has no known allergies.  Body mass index is 40.13 kg/m.  Blood pressure 123/83, pulse 74, temperature 99.1 F (37.3 C), temperature source Oral, height 5\' 6"  (1.676 m), weight 248 lb 9.6 oz (112.8 kg), last menstrual period 05/09/2019, SpO2 99 %.     Review of Systems  Constitutional: Positive for fatigue. Negative for activity change, appetite change, chills, diaphoresis, fever and unexpected weight change.  HENT: Negative for congestion.   Eyes: Negative for visual disturbance.  Respiratory: Negative for cough, chest tightness, shortness of breath, wheezing and stridor.   Cardiovascular: Negative for chest pain, palpitations and leg swelling.  Gastrointestinal: Negative for abdominal distention, anal bleeding, blood in stool, constipation, diarrhea, nausea and vomiting.  Endocrine: Negative for cold intolerance, heat intolerance, polydipsia, polyphagia and polyuria.  Genitourinary: Negative for difficulty urinating, flank pain and hematuria.  Musculoskeletal: Negative for arthralgias, back pain, gait problem, joint swelling, myalgias, neck pain and neck stiffness.  Skin: Negative for color change, pallor, rash and wound.  Neurological: Negative for dizziness, tremors and headaches.  Hematological: Negative for adenopathy. Does not bruise/bleed easily.  Psychiatric/Behavioral: Negative for behavioral problems, confusion, decreased concentration, dysphoric mood, hallucinations, self-injury, sleep disturbance and suicidal ideas. The patient is not nervous/anxious and is not hyperactive.        Objective:   Physical Exam Vitals signs and nursing note reviewed.  Constitutional:      General:  She is not in acute distress.    Appearance: She is obese. She is not ill-appearing, toxic-appearing or diaphoretic.  HENT:     Head: Normocephalic and atraumatic.     Right Ear: Ear canal and external ear normal. No decreased hearing noted. There is no impacted cerumen. Tympanic membrane is not erythematous or bulging.     Left Ear: Tympanic membrane, ear canal and external ear normal. No decreased hearing noted. There is no impacted cerumen. Tympanic membrane is not erythematous or bulging.     Nose:     Right Turbinates: Not swollen.     Left Turbinates: Not swollen.     Mouth/Throat:     Mouth: Mucous membranes are dry.     Dentition: Abnormal dentition. Dental caries present.  Eyes:     Extraocular Movements: Extraocular movements intact.     Conjunctiva/sclera: Conjunctivae normal.     Pupils: Pupils are equal, round, and reactive to light.  Neck:  Musculoskeletal: Neck supple.  Cardiovascular:     Rate and Rhythm: Normal rate.     Pulses: Normal pulses.     Heart sounds: Normal heart sounds. No murmur. No friction rub. No gallop.   Pulmonary:     Effort: Pulmonary effort is normal. No respiratory distress.     Breath sounds: Normal breath sounds. No stridor. No wheezing, rhonchi or rales.  Chest:     Chest wall: No tenderness.  Abdominal:     General: Abdomen is protuberant. Bowel sounds are normal.     Palpations: Abdomen is soft.     Tenderness: There is no abdominal tenderness.  Musculoskeletal: Normal range of motion.        General: No tenderness.  Lymphadenopathy:     Cervical: No cervical adenopathy.  Skin:    General: Skin is warm and dry.     Capillary Refill: Capillary refill takes less than 2 seconds.  Neurological:     Mental Status: She is oriented to person, place, and time.  Psychiatric:        Mood and Affect: Mood normal.        Behavior: Behavior normal.        Thought Content: Thought content normal.        Judgment: Judgment normal.        Assessment & Plan:   1. Screening for breast cancer   2. HTN, goal below 140/90   3. Healthcare maintenance   4. Elevated hemoglobin A1c   5. HTN, goal below 130/80   6. BMI 40.0-44.9, adult [facility])     Healthcare maintenance A1c-5.7 Continue all medications as directed. Recommend that you call [facility] to schedule a d follow-up appt to resume care. Increase plain water intake and resume eating plan as directed by [facility]. Please use Nicoderm patches- you can quit! Please follow-up in 6 months and come fasting. Continue to social distance and wear a mask when in public. Happy 38th Birthday!  HTN, goal below 130/80 Continue Lisinopril 10mg  QD Smoking cessation encouraged  BMI 40.0-44.9, adult ([place]) Encouraged to make f/u with [facility]     FOLLOW-UP:  Return in about 6 months (around 11/11/2019) for Regular Follow Up, HTN, Obesity.

## 2019-05-12 ENCOUNTER — Encounter: Payer: Self-pay | Admitting: Adult Health

## 2019-05-12 ENCOUNTER — Ambulatory Visit (INDEPENDENT_AMBULATORY_CARE_PROVIDER_SITE_OTHER): Payer: No Typology Code available for payment source | Admitting: Adult Health

## 2019-05-12 ENCOUNTER — Other Ambulatory Visit: Payer: Self-pay

## 2019-05-12 VITALS — BP 123/83 | HR 74 | Temp 99.1°F | Ht 66.0 in | Wt 248.6 lb

## 2019-05-12 DIAGNOSIS — Z1239 Encounter for other screening for malignant neoplasm of breast: Secondary | ICD-10-CM | POA: Diagnosis not present

## 2019-05-12 DIAGNOSIS — I10 Essential (primary) hypertension: Secondary | ICD-10-CM | POA: Diagnosis not present

## 2019-05-12 DIAGNOSIS — R7309 Other abnormal glucose: Secondary | ICD-10-CM | POA: Diagnosis not present

## 2019-05-12 DIAGNOSIS — Z0000 Encounter for general adult medical examination without abnormal findings: Secondary | ICD-10-CM | POA: Diagnosis not present

## 2019-05-12 DIAGNOSIS — Z6841 Body Mass Index (BMI) 40.0 and over, adult: Secondary | ICD-10-CM | POA: Insufficient documentation

## 2019-05-12 LAB — POCT GLYCOSYLATED HEMOGLOBIN (HGB A1C): Hemoglobin A1C: 5.7 % — AB (ref 4.0–5.6)

## 2019-05-12 MED ORDER — LISINOPRIL 10 MG PO TABS: 10.0000 mg | ORAL_TABLET | Freq: Every day | ORAL | 1 refills | Status: DC

## 2019-05-12 NOTE — Assessment & Plan Note (Signed)
A1c-5.7 Continue all medications as directed. Recommend that you call [facility] to schedule a d follow-up appt to resume care. Increase plain water intake and resume eating plan as directed by [facility]. Please use Nicoderm patches- you can quit! Please follow-up in 6 months and come fasting. Continue to social distance and wear a mask when in public. Happy 38th Birthday!

## 2019-05-12 NOTE — Assessment & Plan Note (Signed)
Encouraged to make f/u with [facility]

## 2019-05-12 NOTE — Patient Instructions (Addendum)
Preventive Care for Adults, Female  A healthy lifestyle and preventive care can promote health and wellness. Preventive health guidelines for women include the following key practices.   A routine yearly physical is a good way to check with your health care provider about your health and preventive screening. It is a chance to share any concerns and updates on your health and to receive a thorough exam.   Visit your dentist for a routine exam and preventive care every 6 months. Brush your teeth twice a day and floss once a day. Good oral hygiene prevents tooth decay and gum disease.   The frequency of eye exams is based on your age, health, family medical history, use of contact lenses, and other factors. Follow your health care provider's recommendations for frequency of eye exams.   Eat a healthy diet. Foods like vegetables, fruits, whole grains, low-fat dairy products, and lean protein foods contain the nutrients you need without too many calories. Decrease your intake of foods high in solid fats, added sugars, and salt. Eat the right amount of calories for you.Get information about a proper diet from your health care provider, if necessary.   Regular physical exercise is one of the most important things you can do for your health. Most adults should get at least 150 minutes of moderate-intensity exercise (any activity that increases your heart rate and causes you to sweat) each week. In addition, most adults need muscle-strengthening exercises on 2 or more days a week.   Maintain a healthy weight. The body mass index (BMI) is a screening tool to identify possible weight problems. It provides an estimate of body fat based on height and weight. Your health care provider can find your BMI, and can help you achieve or maintain a healthy weight.For adults 20 years and older:   - A BMI below 18.5 is considered underweight.   - A BMI of 18.5 to 24.9 is normal.   - A BMI of 25 to 29.9 is  considered overweight.   - A BMI of 30 and above is considered obese.   Maintain normal blood lipids and cholesterol levels by exercising and minimizing your intake of trans and saturated fats.  Eat a balanced diet with plenty of fruit and vegetables. Blood tests for lipids and cholesterol should begin at age 20 and be repeated every 5 years minimum.  If your lipid or cholesterol levels are high, you are over 40, or you are at high risk for heart disease, you may need your cholesterol levels checked more frequently.Ongoing high lipid and cholesterol levels should be treated with medicines if diet and exercise are not working.   If you smoke, find out from your health care provider how to quit. If you do not use tobacco, do not start.   Lung cancer screening is recommended for adults aged 55-80 years who are at high risk for developing lung cancer because of a history of smoking. A yearly low-dose CT scan of the lungs is recommended for people who have at least a 30-pack-year history of smoking and are a current smoker or have quit within the past 15 years. A pack year of smoking is smoking an average of 1 pack of cigarettes a day for 1 year (for example: 1 pack a day for 30 years or 2 packs a day for 15 years). Yearly screening should continue until the smoker has stopped smoking for at least 15 years. Yearly screening should be stopped for people who develop a   health problem that would prevent them from having lung cancer treatment.   If you are pregnant, do not drink alcohol. If you are breastfeeding, be very cautious about drinking alcohol. If you are not pregnant and choose to drink alcohol, do not have more than 1 drink per day. One drink is considered to be 12 ounces (355 mL) of beer, 5 ounces (148 mL) of wine, or 1.5 ounces (44 mL) of liquor.   Avoid use of street drugs. Do not share needles with anyone. Ask for help if you need support or instructions about stopping the use of  drugs.   High blood pressure causes heart disease and increases the risk of stroke. Your blood pressure should be checked at least yearly.  Ongoing high blood pressure should be treated with medicines if weight loss and exercise do not work.   If you are 69-55 years old, ask your health care provider if you should take aspirin to prevent strokes.   Diabetes screening involves taking a blood sample to check your fasting blood sugar level. This should be done once every 3 years, after age 38, if you are within normal weight and without risk factors for diabetes. Testing should be considered at a younger age or be carried out more frequently if you are overweight and have at least 1 risk factor for diabetes.   Breast cancer screening is essential preventive care for women. You should practice "breast self-awareness."  This means understanding the normal appearance and feel of your breasts and may include breast self-examination.  Any changes detected, no matter how small, should be reported to a health care provider.  Women in their 80s and 30s should have a clinical breast exam (CBE) by a health care provider as part of a regular health exam every 1 to 3 years.  After age 66, women should have a CBE every year.  Starting at age 1, women should consider having a mammogram (breast X-ray test) every year.  Women who have a family history of breast cancer should talk to their health care provider about genetic screening.  Women at a high risk of breast cancer should talk to their health care providers about having an MRI and a mammogram every year.   -Breast cancer gene (BRCA)-related cancer risk assessment is recommended for women who have family members with BRCA-related cancers. BRCA-related cancers include breast, ovarian, tubal, and peritoneal cancers. Having family members with these cancers may be associated with an increased risk for harmful changes (mutations) in the breast cancer genes BRCA1 and  BRCA2. Results of the assessment will determine the need for genetic counseling and BRCA1 and BRCA2 testing.   The Pap test is a screening test for cervical cancer. A Pap test can show cell changes on the cervix that might become cervical cancer if left untreated. A Pap test is a procedure in which cells are obtained and examined from the lower end of the uterus (cervix).   - Women should have a Pap test starting at age 57.   - Between ages 90 and 70, Pap tests should be repeated every 2 years.   - Beginning at age 63, you should have a Pap test every 3 years as long as the past 3 Pap tests have been normal.   - Some women have medical problems that increase the chance of getting cervical cancer. Talk to your health care provider about these problems. It is especially important to talk to your health care provider if a  new problem develops soon after your last Pap test. In these cases, your health care provider may recommend more frequent screening and Pap tests.   - The above recommendations are the same for women who have or have not gotten the vaccine for human papillomavirus (HPV).   - If you had a hysterectomy for a problem that was not cancer or a condition that could lead to cancer, then you no longer need Pap tests. Even if you no longer need a Pap test, a regular exam is a good idea to make sure no other problems are starting.   - If you are between ages 36 and 66 years, and you have had normal Pap tests going back 10 years, you no longer need Pap tests. Even if you no longer need a Pap test, a regular exam is a good idea to make sure no other problems are starting.   - If you have had past treatment for cervical cancer or a condition that could lead to cancer, you need Pap tests and screening for cancer for at least 20 years after your treatment.   - If Pap tests have been discontinued, risk factors (such as a new sexual partner) need to be reassessed to determine if screening should  be resumed.   - The HPV test is an additional test that may be used for cervical cancer screening. The HPV test looks for the virus that can cause the cell changes on the cervix. The cells collected during the Pap test can be tested for HPV. The HPV test could be used to screen women aged 70 years and older, and should be used in women of any age who have unclear Pap test results. After the age of 67, women should have HPV testing at the same frequency as a Pap test.   Colorectal cancer can be detected and often prevented. Most routine colorectal cancer screening begins at the age of 57 years and continues through age 26 years. However, your health care provider may recommend screening at an earlier age if you have risk factors for colon cancer. On a yearly basis, your health care provider may provide home test kits to check for hidden blood in the stool.  Use of a small camera at the end of a tube, to directly examine the colon (sigmoidoscopy or colonoscopy), can detect the earliest forms of colorectal cancer. Talk to your health care provider about this at age 23, when routine screening begins. Direct exam of the colon should be repeated every 5 -10 years through age 49 years, unless early forms of pre-cancerous polyps or small growths are found.   People who are at an increased risk for hepatitis B should be screened for this virus. You are considered at high risk for hepatitis B if:  -You were born in a country where hepatitis B occurs often. Talk with your health care provider about which countries are considered high risk.  - Your parents were born in a high-risk country and you have not received a shot to protect against hepatitis B (hepatitis B vaccine).  - You have HIV or AIDS.  - You use needles to inject street drugs.  - You live with, or have sex with, someone who has Hepatitis B.  - You get hemodialysis treatment.  - You take certain medicines for conditions like cancer, organ  transplantation, and autoimmune conditions.   Hepatitis C blood testing is recommended for all people born from [birth year] through 1965 and any individual  with known risks for hepatitis C.   Practice safe sex. Use condoms and avoid high-risk sexual practices to reduce the spread of sexually transmitted infections (STIs). STIs include gonorrhea, chlamydia, syphilis, trichomonas, herpes, HPV, and human immunodeficiency virus (HIV). Herpes, HIV, and HPV are viral illnesses that have no cure. They can result in disability, cancer, and death. Sexually active women aged 25 years and younger should be checked for chlamydia. Older women with new or multiple partners should also be tested for chlamydia. Testing for other STIs is recommended if you are sexually active and at increased risk.   Osteoporosis is a disease in which the bones lose minerals and strength with aging. This can result in serious bone fractures or breaks. The risk of osteoporosis can be identified using a bone density scan. Women ages 65 years and over and women at risk for fractures or osteoporosis should discuss screening with their health care providers. Ask your health care provider whether you should take a calcium supplement or vitamin D to There are also several preventive steps women can take to avoid osteoporosis and resulting fractures or to keep osteoporosis from worsening. -->Recommendations include:  Eat a balanced diet high in fruits, vegetables, calcium, and vitamins.  Get enough calcium. The recommended total intake of is 1,200 mg daily; for best absorption, if taking supplements, divide doses into 250-500 mg doses throughout the day. Of the two types of calcium, calcium carbonate is best absorbed when taken with food but calcium citrate can be taken on an empty stomach.  Get enough vitamin D. NAMS and the National Osteoporosis Foundation recommend at least 1,000 IU per day for women age 50 and over who are at risk of vitamin D  deficiency. Vitamin D deficiency can be caused by inadequate sun exposure (for example, those who live in northern latitudes).  Avoid alcohol and smoking. Heavy alcohol intake (more than 7 drinks per week) increases the risk of falls and hip fracture and women smokers tend to lose bone more rapidly and have lower bone mass than nonsmokers. Stopping smoking is one of the most important changes women can make to improve their health and decrease risk for disease.  Be physically active every day. Weight-bearing exercise (for example, fast walking, hiking, jogging, and weight training) may strengthen bones or slow the rate of bone loss that comes with aging. Balancing and muscle-strengthening exercises can reduce the risk of falling and fracture.  Consider therapeutic medications. Currently, several types of effective drugs are available. Healthcare providers can recommend the type most appropriate for each woman.  Eliminate environmental factors that may contribute to accidents. Falls cause nearly 90% of all osteoporotic fractures, so reducing this risk is an important bone-health strategy. Measures include ample lighting, removing obstructions to walking, using nonskid rugs on floors, and placing mats and/or grab bars in showers.  Be aware of medication side effects. Some common medicines make bones weaker. These include a type of steroid drug called glucocorticoids used for arthritis and asthma, some antiseizure drugs, certain sleeping pills, treatments for endometriosis, and some cancer drugs. An overactive thyroid gland or using too much thyroid hormone for an underactive thyroid can also be a problem. If you are taking these medicines, talk to your doctor about what you can do to help protect your bones.reduce the rate of osteoporosis.    Menopause can be associated with physical symptoms and risks. Hormone replacement therapy is available to decrease symptoms and risks. You should talk to your  health care provider   about whether hormone replacement therapy is right for you.   Use sunscreen. Apply sunscreen liberally and repeatedly throughout the day. You should seek shade when your shadow is shorter than you. Protect yourself by wearing long sleeves, pants, a wide-brimmed hat, and sunglasses year round, whenever you are outdoors.   Once a month, do a whole body skin exam, using a mirror to look at the skin on your back. Tell your health care provider of new moles, moles that have irregular borders, moles that are larger than a pencil eraser, or moles that have changed in shape or color.   -Stay current with required vaccines (immunizations).   Influenza vaccine. All adults should be immunized every year.  Tetanus, diphtheria, and acellular pertussis (Td, Tdap) vaccine. Pregnant women should receive 1 dose of Tdap vaccine during each pregnancy. The dose should be obtained regardless of the length of time since the last dose. Immunization is preferred during the 27th 36th week of gestation. An adult who has not previously received Tdap or who does not know her vaccine status should receive 1 dose of Tdap. This initial dose should be followed by tetanus and diphtheria toxoids (Td) booster doses every 10 years. Adults with an unknown or incomplete history of completing a 3-dose immunization series with Td-containing vaccines should begin or complete a primary immunization series including a Tdap dose. Adults should receive a Td booster every 10 years.  Varicella vaccine. An adult without evidence of immunity to varicella should receive 2 doses or a second dose if she has previously received 1 dose. Pregnant females who do not have evidence of immunity should receive the first dose after pregnancy. This first dose should be obtained before leaving the health care facility. The second dose should be obtained 4 8 weeks after the first dose.  Human papillomavirus (HPV) vaccine. Females aged 13 26  years who have not received the vaccine previously should obtain the 3-dose series. The vaccine is not recommended for use in pregnant females. However, pregnancy testing is not needed before receiving a dose. If a female is found to be pregnant after receiving a dose, no treatment is needed. In that case, the remaining doses should be delayed until after the pregnancy. Immunization is recommended for any person with an immunocompromised condition through the age of 26 years if she did not get any or all doses earlier. During the 3-dose series, the second dose should be obtained 4 8 weeks after the first dose. The third dose should be obtained 24 weeks after the first dose and 16 weeks after the second dose.  Zoster vaccine. One dose is recommended for adults aged 60 years or older unless certain conditions are present.  Measles, mumps, and rubella (MMR) vaccine. Adults born before 1957 generally are considered immune to measles and mumps. Adults born in 1957 or later should have 1 or more doses of MMR vaccine unless there is a contraindication to the vaccine or there is laboratory evidence of immunity to each of the three diseases. A routine second dose of MMR vaccine should be obtained at least 28 days after the first dose for students attending postsecondary schools, health care workers, or international travelers. People who received inactivated measles vaccine or an unknown type of measles vaccine during 1963 1967 should receive 2 doses of MMR vaccine. People who received inactivated mumps vaccine or an unknown type of mumps vaccine before 1979 and are at high risk for mumps infection should consider immunization with 2 doses of   MMR vaccine. For females of childbearing age, rubella immunity should be determined. If there is no evidence of immunity, females who are not pregnant should be vaccinated. If there is no evidence of immunity, females who are pregnant should delay immunization until after pregnancy.  Unvaccinated health care workers born before [birth year] who lack laboratory evidence of measles, mumps, or rubella immunity or laboratory confirmation of disease should consider measles and mumps immunization with 2 doses of MMR vaccine or rubella immunization with 1 dose of MMR vaccine.  Pneumococcal 13-valent conjugate (PCV13) vaccine. When indicated, a person who is uncertain of her immunization history and has no record of immunization should receive the PCV13 vaccine. An adult aged 54 years or older who has certain medical conditions and has not been previously immunized should receive 1 dose of PCV13 vaccine. This PCV13 should be followed with a dose of pneumococcal polysaccharide (PPSV23) vaccine. The PPSV23 vaccine dose should be obtained at least 8 weeks after the dose of PCV13 vaccine. An adult aged 58 years or older who has certain medical conditions and previously received 1 or more doses of PPSV23 vaccine should receive 1 dose of PCV13. The PCV13 vaccine dose should be obtained 1 or more years after the last PPSV23 vaccine dose.  Pneumococcal polysaccharide (PPSV23) vaccine. When PCV13 is also indicated, PCV13 should be obtained first. All adults aged 58 years and older should be immunized. An adult younger than age 65 years who has certain medical conditions should be immunized. Any person who resides in a nursing home or long-term care facility should be immunized. An adult smoker should be immunized. People with an immunocompromised condition and certain other conditions should receive both PCV13 and PPSV23 vaccines. People with human immunodeficiency virus (HIV) infection should be immunized as soon as possible after diagnosis. Immunization during chemotherapy or radiation therapy should be avoided. Routine use of PPSV23 vaccine is not recommended for American Indians, [place] Natives, or people younger than 65 years unless there are medical conditions that require PPSV23 vaccine. When indicated,  people who have unknown immunization and have no record of immunization should receive PPSV23 vaccine. One-time revaccination 5 years after the first dose of PPSV23 is recommended for people aged 70 64 years who have chronic kidney failure, nephrotic syndrome, asplenia, or immunocompromised conditions. People who received 1 2 doses of PPSV23 before age 32 years should receive another dose of PPSV23 vaccine at age 96 years or later if at least 5 years have passed since the previous dose. Doses of PPSV23 are not needed for people immunized with PPSV23 at or after age 55 years.  Meningococcal vaccine. Adults with asplenia or persistent complement component deficiencies should receive 2 doses of quadrivalent meningococcal conjugate (MenACWY-D) vaccine. The doses should be obtained at least 2 months apart. Microbiologists working with certain meningococcal bacteria, [employer] recruits, people at risk during an outbreak, and people who travel to or live in countries with a high rate of meningitis should be immunized. A first-year college student up through age 58 years who is living in a residence hall should receive a dose if she did not receive a dose on or after her 16th birthday. Adults who have certain high-risk conditions should receive one or more doses of vaccine.  Hepatitis A vaccine. Adults who wish to be protected from this disease, have certain high-risk conditions, work with hepatitis A-infected animals, work in hepatitis A research labs, or travel to or work in countries with a high rate of hepatitis A should be  immunized. Adults who were previously unvaccinated and who anticipate close contact with an international adoptee during the first 60 days after arrival in the [place] from a country with a high rate of hepatitis A should be immunized.  Hepatitis B vaccine.  Adults who wish to be protected from this disease, have certain high-risk conditions, may be exposed to blood or other infectious  body fluids, are household contacts or sex partners of hepatitis B positive people, are clients or workers in certain care facilities, or travel to or work in countries with a high rate of hepatitis B should be immunized.  Haemophilus influenzae type b (Hib) vaccine. A previously unvaccinated person with asplenia or sickle cell disease or having a scheduled splenectomy should receive 1 dose of Hib vaccine. Regardless of previous immunization, a recipient of a hematopoietic stem cell transplant should receive a 3-dose series 6 12 months after her successful transplant. Hib vaccine is not recommended for adults with HIV infection.  Preventive Services / Frequency Ages 6 to 39years  Blood pressure check.** / Every 1 to 2 years.  Lipid and cholesterol check.** / Every 5 years beginning at age 39.  Clinical breast exam.** / Every 3 years for women in their 61s and 62s.  BRCA-related cancer risk assessment.** / For women who have family members with a BRCA-related cancer (breast, ovarian, tubal, or peritoneal cancers).  Pap test.** / Every 2 years from ages 47 through 85. Every 3 years starting at age 34 through age 12 or 74 with a history of 3 consecutive normal Pap tests.  HPV screening.** / Every 3 years from ages 46 through ages 43 to 54 with a history of 3 consecutive normal Pap tests.  Hepatitis C blood test.** / For any individual with known risks for hepatitis C.  Skin self-exam. / Monthly.  Influenza vaccine. / Every year.  Tetanus, diphtheria, and acellular pertussis (Tdap, Td) vaccine.** / Consult your health care provider. Pregnant women should receive 1 dose of Tdap vaccine during each pregnancy. 1 dose of Td every 10 years.  Varicella vaccine.** / Consult your health care provider. Pregnant females who do not have evidence of immunity should receive the first dose after pregnancy.  HPV vaccine. / 3 doses over 6 months, if 64 and younger. The vaccine is not recommended for use in  pregnant females. However, pregnancy testing is not needed before receiving a dose.  Measles, mumps, rubella (MMR) vaccine.** / You need at least 1 dose of MMR if you were born in 1957 or later. You may also need a 2nd dose. For females of childbearing age, rubella immunity should be determined. If there is no evidence of immunity, females who are not pregnant should be vaccinated. If there is no evidence of immunity, females who are pregnant should delay immunization until after pregnancy.  Pneumococcal 13-valent conjugate (PCV13) vaccine.** / Consult your health care provider.  Pneumococcal polysaccharide (PPSV23) vaccine.** / 1 to 2 doses if you smoke cigarettes or if you have certain conditions.  Meningococcal vaccine.** / 1 dose if you are age 71 to 37 years and a Market researcher living in a residence hall, or have one of several medical conditions, you need to get vaccinated against meningococcal disease. You may also need additional booster doses.  Hepatitis A vaccine.** / Consult your health care provider.  Hepatitis B vaccine.** / Consult your health care provider.  Haemophilus influenzae type b (Hib) vaccine.** / Consult your health care provider.  Ages 55 to 64years  Blood pressure check.** / Every 1 to 2 years.  Lipid and cholesterol check.** / Every 5 years beginning at age 20 years.  Lung cancer screening. / Every year if you are aged 55 80 years and have a 30-pack-year history of smoking and currently smoke or have quit within the past 15 years. Yearly screening is stopped once you have quit smoking for at least 15 years or develop a health problem that would prevent you from having lung cancer treatment.  Clinical breast exam.** / Every year after age 40 years.  BRCA-related cancer risk assessment.** / For women who have family members with a BRCA-related cancer (breast, ovarian, tubal, or peritoneal cancers).  Mammogram.** / Every year beginning at age 40  years and continuing for as long as you are in good health. Consult with your health care provider.  Pap test.** / Every 3 years starting at age 30 years through age 65 or 70 years with a history of 3 consecutive normal Pap tests.  HPV screening.** / Every 3 years from ages 30 years through ages 65 to 70 years with a history of 3 consecutive normal Pap tests.  Fecal occult blood test (FOBT) of stool. / Every year beginning at age 50 years and continuing until age 75 years. You may not need to do this test if you get a colonoscopy every 10 years.  Flexible sigmoidoscopy or colonoscopy.** / Every 5 years for a flexible sigmoidoscopy or every 10 years for a colonoscopy beginning at age 50 years and continuing until age 75 years.  Hepatitis C blood test.** / For all people born from 1945 through 1965 and any individual with known risks for hepatitis C.  Skin self-exam. / Monthly.  Influenza vaccine. / Every year.  Tetanus, diphtheria, and acellular pertussis (Tdap/Td) vaccine.** / Consult your health care provider. Pregnant women should receive 1 dose of Tdap vaccine during each pregnancy. 1 dose of Td every 10 years.  Varicella vaccine.** / Consult your health care provider. Pregnant females who do not have evidence of immunity should receive the first dose after pregnancy.  Zoster vaccine.** / 1 dose for adults aged 60 years or older.  Measles, mumps, rubella (MMR) vaccine.** / You need at least 1 dose of MMR if you were born in 1957 or later. You may also need a 2nd dose. For females of childbearing age, rubella immunity should be determined. If there is no evidence of immunity, females who are not pregnant should be vaccinated. If there is no evidence of immunity, females who are pregnant should delay immunization until after pregnancy.  Pneumococcal 13-valent conjugate (PCV13) vaccine.** / Consult your health care provider.  Pneumococcal polysaccharide (PPSV23) vaccine.** / 1 to 2 doses if  you smoke cigarettes or if you have certain conditions.  Meningococcal vaccine.** / Consult your health care provider.  Hepatitis A vaccine.** / Consult your health care provider.  Hepatitis B vaccine.** / Consult your health care provider.  Haemophilus influenzae type b (Hib) vaccine.** / Consult your health care provider.  Ages 65 years and over  Blood pressure check.** / Every 1 to 2 years.  Lipid and cholesterol check.** / Every 5 years beginning at age 20 years.  Lung cancer screening. / Every year if you are aged 55 80 years and have a 30-pack-year history of smoking and currently smoke or have quit within the past 15 years. Yearly screening is stopped once you have quit smoking for at least 15 years or develop a health problem that   would prevent you from having lung cancer treatment.  Clinical breast exam.** / Every year after age 103 years.  BRCA-related cancer risk assessment.** / For women who have family members with a BRCA-related cancer (breast, ovarian, tubal, or peritoneal cancers).  Mammogram.** / Every year beginning at age 36 years and continuing for as long as you are in good health. Consult with your health care provider.  Pap test.** / Every 3 years starting at age 5 years through age 85 or 10 years with 3 consecutive normal Pap tests. Testing can be stopped between 65 and 70 years with 3 consecutive normal Pap tests and no abnormal Pap or HPV tests in the past 10 years.  HPV screening.** / Every 3 years from ages 93 years through ages 70 or 45 years with a history of 3 consecutive normal Pap tests. Testing can be stopped between 65 and 70 years with 3 consecutive normal Pap tests and no abnormal Pap or HPV tests in the past 10 years.  Fecal occult blood test (FOBT) of stool. / Every year beginning at age 8 years and continuing until age 45 years. You may not need to do this test if you get a colonoscopy every 10 years.  Flexible sigmoidoscopy or colonoscopy.** /  Every 5 years for a flexible sigmoidoscopy or every 10 years for a colonoscopy beginning at age 69 years and continuing until age 68 years.  Hepatitis C blood test.** / For all people born from [birth year] through 1967 and any individual with known risks for hepatitis C.  Osteoporosis screening.** / A one-time screening for women ages 7 years and over and women at risk for fractures or osteoporosis.  Skin self-exam. / Monthly.  Influenza vaccine. / Every year.  Tetanus, diphtheria, and acellular pertussis (Tdap/Td) vaccine.** / 1 dose of Td every 10 years.  Varicella vaccine.** / Consult your health care provider.  Zoster vaccine.** / 1 dose for adults aged 5 years or older.  Pneumococcal 13-valent conjugate (PCV13) vaccine.** / Consult your health care provider.  Pneumococcal polysaccharide (PPSV23) vaccine.** / 1 dose for all adults aged 74 years and older.  Meningococcal vaccine.** / Consult your health care provider.  Hepatitis A vaccine.** / Consult your health care provider.  Hepatitis B vaccine.** / Consult your health care provider.  Haemophilus influenzae type b (Hib) vaccine.** / Consult your health care provider. ** Family history and personal history of risk and conditions may change your health care provider's recommendations. Document Released: 12/30/2001 Document Revised: 08/24/2013  [facility] Patient Information 2014 [place], [place].   EXERCISE AND DIET:  We recommended that you start or continue a regular exercise program for good health. Regular exercise means any activity that makes your heart beat faster and makes you sweat.  We recommend exercising at least 30 minutes per day at least 3 days a week, preferably 5.  We also recommend a diet low in fat and sugar / carbohydrates.  Inactivity, poor dietary choices and obesity can cause diabetes, heart attack, stroke, and kidney damage, among others.     ALCOHOL AND SMOKING:  Women should limit their alcohol intake to no  more than 7 drinks/beers/glasses of wine (combined, not each!) per week. Moderation of alcohol intake to this level decreases your risk of breast cancer and liver damage.  ( And of course, no recreational drugs are part of a healthy lifestyle.)  Also, you should not be smoking at all or even being exposed to second hand smoke. Most people know smoking can  cause cancer, and various heart and lung diseases, but did you know it also contributes to weakening of your bones?  Aging of your skin?  Yellowing of your teeth and nails?   CALCIUM AND VITAMIN D:  Adequate intake of calcium and Vitamin D are recommended.  The recommendations for exact amounts of these supplements seem to change often, but generally speaking 600 mg of calcium (either carbonate or citrate) and 800 units of Vitamin D per day seems prudent. Certain women may benefit from higher intake of Vitamin D.  If you are among these women, your doctor will have told you during your visit.     PAP SMEARS:  Pap smears, to check for cervical cancer or precancers,  have traditionally been done yearly, although recent scientific advances have shown that most women can have pap smears less often.  However, every woman still should have a physical exam from her gynecologist or primary care physician every year. It will include a breast check, inspection of the vulva and vagina to check for abnormal growths or skin changes, a visual exam of the cervix, and then an exam to evaluate the size and shape of the uterus and ovaries.  And after 40 years of age, a rectal exam is indicated to check for rectal cancers. We will also provide age appropriate advice regarding health maintenance, like when you should have certain vaccines, screening for sexually transmitted diseases, bone density testing, colonoscopy, mammograms, etc.    MAMMOGRAMS:  All women over 61 years old should have a yearly mammogram. Many facilities now offer a "3D" mammogram, which may cost  around $50 extra out of pocket. If possible,  we recommend you accept the option to have the 3D mammogram performed.  It both reduces the number of women who will be called back for extra views which then turn out to be normal, and it is better than the routine mammogram at detecting truly abnormal areas.     COLONOSCOPY:  Colonoscopy to screen for colon cancer is recommended for all women at age 40.  We know, you hate the idea of the prep.  We agree, BUT, having colon cancer and not knowing it is worse!!  Colon cancer so often starts as a polyp that can be seen and removed at colonscopy, which can quite literally save your life!  And if your first colonoscopy is normal and you have no family history of colon cancer, most women don't have to have it again for 10 years.  Once every ten years, you can do something that may end up saving your life, right?  We will be happy to help you get it scheduled when you are ready.  Be sure to check your insurance coverage so you understand how much it will cost.  It may be covered as a preventative service at no cost, but you should check your particular policy.   A1c-5.7 Continue all medications as directed. Recommend that you call [facility] to schedule a d follow-up appt to resume care. Increase plain water intake and resume eating plan as directed by [facility]. Please use Nicoderm patches- you can quit! Please follow-up in 6 months and come fasting. Continue to social distance and wear a mask when in public. Happy 38th Birthday! GREAT TO SEE YOU!

## 2019-05-12 NOTE — Assessment & Plan Note (Signed)
Continue Lisinopril 10mg  QD Smoking cessation encouraged

## 2019-05-17 ENCOUNTER — Encounter: Payer: Self-pay | Admitting: Adult Health

## 2019-09-19 ENCOUNTER — Other Ambulatory Visit: Payer: Self-pay | Admitting: *Deleted

## 2019-09-19 DIAGNOSIS — Z20822 Contact with and (suspected) exposure to covid-19: Secondary | ICD-10-CM

## 2019-09-20 LAB — NOVEL CORONAVIRUS, NAA: SARS-CoV-2, NAA: NOT DETECTED

## 2019-10-31 ENCOUNTER — Ambulatory Visit (INDEPENDENT_AMBULATORY_CARE_PROVIDER_SITE_OTHER): Payer: No Typology Code available for payment source | Admitting: Adult Health

## 2019-10-31 ENCOUNTER — Other Ambulatory Visit: Payer: Self-pay

## 2019-10-31 ENCOUNTER — Encounter: Payer: Self-pay | Admitting: Adult Health

## 2019-10-31 ENCOUNTER — Ambulatory Visit: Payer: No Typology Code available for payment source | Admitting: Family Medicine

## 2019-10-31 DIAGNOSIS — F17210 Nicotine dependence, cigarettes, uncomplicated: Secondary | ICD-10-CM

## 2019-10-31 DIAGNOSIS — I10 Essential (primary) hypertension: Secondary | ICD-10-CM | POA: Diagnosis not present

## 2019-10-31 DIAGNOSIS — Z0000 Encounter for general adult medical examination without abnormal findings: Secondary | ICD-10-CM | POA: Diagnosis not present

## 2019-10-31 DIAGNOSIS — F17200 Nicotine dependence, unspecified, uncomplicated: Secondary | ICD-10-CM

## 2019-10-31 MED ORDER — BUPROPION HCL ER (SR) 150 MG PO TB12: ORAL_TABLET | ORAL | 0 refills | Status: DC

## 2019-10-31 NOTE — Assessment & Plan Note (Signed)
Currently smoking 1/2- pack per day She has tried chantix in past, experienced SE. Nicoderm did not help with reducing tobacco. She reports stable mood. Wellbutrin SR 150mg  QD for 3 days, then increase to BID.

## 2019-10-31 NOTE — Assessment & Plan Note (Signed)
Assessment and Plan: Continue all medications as directed, with one addition- Wellbutrin SR 150mg  QD for 3 days, then increase to BID. Increase water intake, strive for at least 100 ounces/day.   Follow Heart Healthy diet Increase regular exercise.  Recommend at least 30 minutes daily, 5 days per week of walking, biking, swimming, YouTube/Pinterest workout videos. Continue to social distance and wear a mask when in public.  Follow Up Instructions: Fasting labs 1-2 weeks TeleMedicine f/u 3-4 weeks, re: Started on Wellbutrin for smoking cessation.   I discussed the assessment and treatment plan with the patient. The patient was provided an opportunity to ask questions and all were answered. The patient agreed with the plan and demonstrated an understanding of the instructions.   The patient was advised to call back or seek an in-person evaluation if the symptoms worsen or if the condition fails to improve as anticipated.

## 2019-10-31 NOTE — Assessment & Plan Note (Signed)
She is currently Lisinopril 10mg  QD Ambulatory BP readings- SBP 120-130 DBP 80s She denies acute cardiac sx's.

## 2019-10-31 NOTE — Progress Notes (Signed)
Virtual Visit via Telephone Note  I connected with Sylvia Warren on 10/31/19 at 10:00 AM EST by telephone and verified that I am speaking with the correct person using two identifiers.  Location: Patient: Home Provider: In Clinic   I discussed the limitations, risks, security and privacy concerns of performing an evaluation and management service by telephone and the availability of in person appointments. I also discussed with the patient that there may be a patient responsible charge related to this service. The patient expressed understanding and agreed to proceed.   History of Present Illness: 05/12/2019 OV: Sylvia Warren presents for CPE She continues to smoke- 1/2 pack/day. She has not tried the Nicoderm patches- encouraged to at least try patch for 2 weeks. She estimates to drink 50 oz/day and 5 "large cups of coffee" per day- she denies insomnia. She has not been following the eating plan as drafted by [facility]. She denies regular exercise. She reports only taking 2 dose of Metformin-we discussed the benefits of this rx- lowering blood glucose and weight loss. She reports HAs are well controlled on Topiramate ER 50mg - denies paresthesias   Recent Labs       Lab Results  Component Value Date   HGBA1C 5.7 (A) 05/12/2019   HGBA1C 5.8 (H) 11/22/2018   HGBA1C 5.7 (H) 01/07/2018     02/03/2019 COVID test- negative JUST TURNED 38  10/31/2019 OV: Sylvia Warren calls in for 6 month f/u:HTN, Migraine HA, Obesity, Tobacco use She is currently Lisinopril 10mg  QD Ambulatory BP readings- SBP 120-130 DBP 80s She denies acute cardiac sx's. She walks outside several times per week- [place] Rudd in duration. She is followed by Neurology/Dr. She estimates 2 HA/month-currently on Topiramate ER 50mg  QD, Rizatriptan 5mg  PRN She continue to smoke 1/2 pack to 1 pack per day. She has tried chantix in past, experienced SE. Nicoderm did not help with reducing  tobacco. She is agreeable to trying Wellbutrin to stop tobacco use. She reports stable mood. She needs fasting labs in the next 1-2 weeks. Patient Care Team    Relationship Specialty Notifications Start End  D, NP PCP - General Family Medicine  12/16/17   [facility],    12/16/17   [facility]  Orthopedic Surgery  12/17/17     Patient Active Problem List   Diagnosis Date Noted  . BMI 40.0-44.9, adult (HCC) 05/12/2019  . Cough 02/03/2019  . Fever 02/03/2019  . Sciatica of left side 10/26/2018  . Other complicated headache syndrome 07/12/2018  . Great toe pain, left 06/16/2018  . RUQ pain 05/03/2018  . Vitamin D deficiency 02/10/2018  . Migraine with status migrainosus, not intractable 02/10/2018  . HTN, goal below 130/80 12/16/2017  . Tobacco use disorder 12/16/2017  . Healthcare maintenance 12/16/2017     Past Medical History:  Diagnosis Date  . ADHD   . COVID-19   . Hypertension   . Lumbar herniated disc   . Migraine   . Pain   . Sciatica      Past Surgical History:  Procedure Laterality Date  . CESAREAN SECTION    . KNEE ARTHROSCOPY    . TUBAL LIGATION       Family History  Problem Relation Age of Onset  . Hypertension Mother   . Cirrhosis Mother   . Kidney disease Mother   . Obesity Mother   . Healthy Sister   . Healthy Daughter   . Healthy Son   .  Cancer Maternal Aunt        vulvular  . Diabetes Paternal Grandmother   . Healthy Sister   . Healthy Sister   . Healthy Son      Social History   Substance and Sexual Activity  Drug Use No     Social History   Substance and Sexual Activity  Alcohol Use No     Social History   Tobacco Use  Smoking Status Current Every Day Smoker  . Packs/day: 1.00  . Years: 18.00  . Pack years: 18.00  . Types: Cigarettes  Smokeless Tobacco Never Used     Outpatient Encounter Medications as of 10/31/2019  Medication Sig  . ibuprofen (ADVIL,MOTRIN) 200 MG tablet Take 400  mg by mouth every 6 (six) hours as needed.  Marland Kitchen lisinopril (ZESTRIL) 10 MG tablet Take 1 tablet (10 mg total) by mouth daily.  . rizatriptan (MAXALT) 5 MG tablet Take 1 tablet (5 mg total) by mouth as needed for migraine. May repeat in 2 hours if needed  . Topiramate ER (TROKENDI XR) 50 MG CP24 Take 50 mg by mouth at bedtime.   No facility-administered encounter medications on file as of 10/31/2019.    Allergies: Patient has no known allergies.  There is no height or weight on file to calculate BMI.  There were no vitals taken for this visit. Review of Systems: General:   Denies fever, chills, unexplained weight loss.  Optho/Auditory:   Denies visual changes, blurred vision/LOV Respiratory:   Denies SOB, DOE more than baseline levels.  Cardiovascular:   Denies chest pain, palpitations, new onset peripheral edema  Gastrointestinal:   Denies nausea, vomiting, diarrhea.  Genitourinary: Denies dysuria, freq/ urgency, flank pain or discharge from genitals.  Endocrine:     Denies hot or cold intolerance, polyuria, polydipsia. Musculoskeletal:   Denies unexplained myalgias, joint swelling, unexplained arthralgias, gait problems.  Skin:  Denies rash, suspicious lesions Neurological:     Denies dizziness, unexplained weakness, numbness  Psychiatric/Behavioral:   Denies mood changes, suicidal or homicidal ideations, hallucinations  Observations/Objective: No acute distress noted during the telephone conversation.  Assessment and Plan: Continue all medications as directed, with one addition- Wellbutrin SR 150mg  QD for 3 days, then increase to BID. Increase water intake, strive for at least 100 ounces/day.   Follow Heart Healthy diet Increase regular exercise.  Recommend at least 30 minutes daily, 5 days per week of walking, biking, swimming, YouTube/Pinterest workout videos. Continue to social distance and wear a mask when in public.  Follow Up Instructions: Fasting labs 1-2  weeks TeleMedicine f/u 3-4 weeks, re: Started on Wellbutrin for smoking cessation.   I discussed the assessment and treatment plan with the patient. The patient was provided an opportunity to ask questions and all were answered. The patient agreed with the plan and demonstrated an understanding of the instructions.   The patient was advised to call back or seek an in-person evaluation if the symptoms worsen or if the condition fails to improve as anticipated.  I provided 12  minutes of non-face-to-face time during this encounter.   Esaw Grandchild, NP

## 2019-11-07 ENCOUNTER — Other Ambulatory Visit: Payer: No Typology Code available for payment source

## 2019-11-09 ENCOUNTER — Other Ambulatory Visit: Payer: No Typology Code available for payment source

## 2019-11-09 ENCOUNTER — Other Ambulatory Visit: Payer: Self-pay

## 2019-11-09 DIAGNOSIS — I10 Essential (primary) hypertension: Secondary | ICD-10-CM

## 2019-11-09 DIAGNOSIS — Z0000 Encounter for general adult medical examination without abnormal findings: Secondary | ICD-10-CM

## 2019-11-10 LAB — CBC WITH DIFFERENTIAL/PLATELET
Basophils Absolute: 0.1 10*3/uL (ref 0.0–0.2)
Basos: 1 %
EOS (ABSOLUTE): 0.3 10*3/uL (ref 0.0–0.4)
Eos: 4 %
Hematocrit: 41.8 % (ref 34.0–46.6)
Hemoglobin: 14.1 g/dL (ref 11.1–15.9)
Immature Grans (Abs): 0 10*3/uL (ref 0.0–0.1)
Immature Granulocytes: 0 %
Lymphocytes Absolute: 2.9 10*3/uL (ref 0.7–3.1)
Lymphs: 37 %
MCH: 30.1 pg (ref 26.6–33.0)
MCHC: 33.7 g/dL (ref 31.5–35.7)
MCV: 89 fL (ref 79–97)
Monocytes Absolute: 0.6 10*3/uL (ref 0.1–0.9)
Monocytes: 8 %
Neutrophils Absolute: 3.9 10*3/uL (ref 1.4–7.0)
Neutrophils: 50 %
Platelets: 336 10*3/uL (ref 150–450)
RBC: 4.69 x10E6/uL (ref 3.77–5.28)
RDW: 13.4 % (ref 11.7–15.4)
WBC: 7.8 10*3/uL (ref 3.4–10.8)

## 2019-11-10 LAB — COMPREHENSIVE METABOLIC PANEL
ALT: 17 IU/L (ref 0–32)
AST: 15 IU/L (ref 0–40)
Albumin/Globulin Ratio: 1.5 (ref 1.2–2.2)
Albumin: 4.1 g/dL (ref 3.8–4.8)
Alkaline Phosphatase: 82 IU/L (ref 39–117)
BUN/Creatinine Ratio: 11 (ref 9–23)
BUN: 7 mg/dL (ref 6–24)
Bilirubin Total: 0.2 mg/dL (ref 0.0–1.2)
CO2: 23 mmol/L (ref 20–29)
Calcium: 9.4 mg/dL (ref 8.7–10.2)
Chloride: 102 mmol/L (ref 96–106)
Creatinine, Ser: 0.63 mg/dL (ref 0.57–1.00)
GFR calc Af Amer: 130 mL/min/{1.73_m2} (ref >59)
GFR calc non Af Amer: 113 mL/min/{1.73_m2} (ref >59)
Globulin, Total: 2.7 g/dL (ref 1.5–4.5)
Glucose: 83 mg/dL (ref 65–99)
Potassium: 4.9 mmol/L (ref 3.5–5.2)
Sodium: 138 mmol/L (ref 134–144)
Total Protein: 6.8 g/dL (ref 6.0–8.5)

## 2019-11-10 LAB — HEMOGLOBIN A1C
Est. average glucose Bld gHb Est-mCnc: 117 mg/dL
Hgb A1c MFr Bld: 5.7 % — ABNORMAL HIGH (ref 4.8–5.6)

## 2019-11-10 LAB — LIPID PANEL
Chol/HDL Ratio: 5.1 ratio — ABNORMAL HIGH (ref 0.0–4.4)
Cholesterol, Total: 230 mg/dL — ABNORMAL HIGH (ref 100–199)
HDL: 45 mg/dL (ref >39)
LDL Chol Calc (NIH): 160 mg/dL — ABNORMAL HIGH (ref 0–99)
Triglycerides: 138 mg/dL (ref 0–149)
VLDL Cholesterol Cal: 25 mg/dL (ref 5–40)

## 2019-11-10 LAB — TSH: TSH: 2 u[IU]/mL (ref 0.450–4.500)

## 2019-11-10 LAB — T4, FREE: Free T4: 1.27 ng/dL (ref 0.82–1.77)

## 2019-11-10 LAB — T3: T3, Total: 145 ng/dL (ref 71–180)

## 2019-11-19 NOTE — Progress Notes (Signed)
Virtual Visit via Telephone Note  I connected with Sylvia Warren on 11/20/2018 at  8:15 AM EST by telephone and verified that I am speaking with the correct person using two identifiers.  Location: Patient: Home Provider: In Clinic   I discussed the limitations, risks, security and privacy concerns of performing an evaluation and management service by telephone and the availability of in person appointments. I also discussed with the patient that there may be a patient responsible charge related to this service. The patient expressed understanding and agreed to proceed.   History of Present Illness: 10/31/2019 OV: Sylvia Warren calls in for 6 month f/u:HTN, Migraine HA, Obesity, Tobacco use She is currently Lisinopril 10mg  QD Ambulatory BP readings- SBP 120-130 DBP 80s She denies acute cardiac sx's. She walks outside several times per week- 23mins in duration. She is followed by Neurology/Dr. Jaynee Eagles She estimates 2 HA/month-currently on Topiramate ER 50mg  QD, Rizatriptan 5mg  PRN She continue to smoke 1/2 pack to 1 pack per day. She has tried chantix in past, experienced SE. Nicoderm did not help with reducing tobacco. She is agreeable to trying Wellbutrin to stop tobacco use. She reports stable mood. She needs fasting labs in the next 1-2 weeks. 11/20/2018 OV: Sylvia Warren calls in HTN and lab f/u She is currently Lisinopril 10mg  QD Ambulatory BP readings- SBP 120s DBP 80s She denies acute cardiac sx's. She continues to use tobacco- 1/2 pack per day-she was recently started on Wellbutrin SR 150 mg, has titrated up to BID. She has reduced from a pack/day- great job! She continues to walk 72mins 2-3 times/week. She estimates to drink 3 x 16 oz/day. She has not followed up with [facility], last OV March 2020. She and her family started meal prepping on sundays- been trying to follow Heart health diet  11/09/2019 Labs: TSH-2.000 Free T4-  1.27 T3-145 A1c-5.7 CMP-stable CBC-stable The 10-year ASCVD risk score Mikey Bussing DC Jr., et al., 2013) is: 5.8% Values used to calculate the score:  Age: 39 years  Sex: Female  Is Non-Hispanic African American: No  Diabetic: No  Tobacco smoker: Yes  Systolic Blood Pressure: 237 mmHg  Is BP treated: Yes  HDL Cholesterol: 45 mg/dL  Total Cholesterol: 230 mg/dL  LDL-160, LDL 11/23/2018 122 She is agreeable to statin therapy 3 times per week  Patient Care Team    Relationship Specialty Notifications Start End  Esaw Grandchild, NP PCP - General Family Medicine  12/16/17   [facility]    12/16/17   [facility]  12/17/17     Patient Active Problem List   Diagnosis Date Noted  . BMI 40.0-44.9, adult ([place]) 05/12/2019  . Cough 02/03/2019  . Fever 02/03/2019  . Sciatica of left side 10/26/2018  . Other complicated headache syndrome 07/12/2018  . Great toe pain, left 06/16/2018  . RUQ pain 05/03/2018  . Vitamin D deficiency 02/10/2018  . Migraine with status migrainosus, not intractable 02/10/2018  . HTN, goal below 130/80 12/16/2017  . Tobacco use disorder 12/16/2017  . Healthcare maintenance 12/16/2017     Past Medical History:  Diagnosis Date  . ADHD   . COVID-19   . Hypertension   . Lumbar herniated disc   . Migraine   . Pain   . Sciatica      Past Surgical History:  Procedure Laterality Date  . CESAREAN SECTION    . KNEE ARTHROSCOPY    . TUBAL LIGATION  Family History  Problem Relation Age of Onset  . Hypertension Mother   . Cirrhosis Mother   . Kidney disease Mother   . Obesity Mother   . Healthy Sister   . Healthy Daughter   . Healthy Son   . Cancer Maternal Aunt        vulvular  . Diabetes Paternal Grandmother   . Healthy Sister   . Healthy Sister   . Healthy Son      Social History   Substance and Sexual Activity  Drug Use No     Social History   Substance and Sexual  Activity  Alcohol Use No     Social History   Tobacco Use  Smoking Status Current Every Day Smoker  . Packs/day: 1.00  . Years: 18.00  . Pack years: 18.00  . Types: Cigarettes  Smokeless Tobacco Never Used     Outpatient Encounter Medications as of 11/21/2019  Medication Sig  . buPROPion (WELLBUTRIN SR) 150 MG 12 hr tablet One tablet by mouth daily for 3 days, then increase to twice daily- hold at this dose.  . ibuprofen (ADVIL,MOTRIN) 200 MG tablet Take 400 mg by mouth every 6 (six) hours as needed.  Marland Kitchen lisinopril (ZESTRIL) 10 MG tablet Take 1 tablet (10 mg total) by mouth daily.  . rizatriptan (MAXALT) 5 MG tablet Take 1 tablet (5 mg total) by mouth as needed for migraine. May repeat in 2 hours if needed  . Topiramate ER (TROKENDI XR) 50 MG CP24 Take 50 mg by mouth at bedtime.   No facility-administered encounter medications on file as of 11/21/2019.    Allergies: Patient has no known allergies.  There is no height or weight on file to calculate BMI.  There were no vitals taken for this visit. Review of Systems: General:   Denies fever, chills, unexplained weight loss.  Optho/Auditory:   Denies visual changes, blurred vision/LOV Respiratory:   Denies SOB, DOE more than baseline levels.  Cardiovascular:   Denies chest pain, palpitations, new onset peripheral edema  Gastrointestinal:   Denies nausea, vomiting, diarrhea.  Genitourinary: Denies dysuria, freq/ urgency, flank pain or discharge from genitals.  Endocrine:     Denies hot or cold intolerance, polyuria, polydipsia. Musculoskeletal:   Denies unexplained myalgias, joint swelling, unexplained arthralgias, gait problems.  Skin:  Denies rash, suspicious lesions Neurological:     Denies dizziness, unexplained weakness, numbness  Psychiatric/Behavioral:   Denies mood changes, suicidal or homicidal ideations, hallucinations   Observations/Objective: No acute distress noted during the telephone conversation  Assessment  and Plan: LDL increased >35 pts in last year She is agreeable to statin therapy 3 times per week- Rosuvastatin 10mg  CMP stable Continue to reduce tobacco use- she has titrated up on Wellbutrin SR 150mg  BID Remain well hydrated, continue meal prep- hearty healthy diet, increase daily walking. Continue to social distance and wear a mask when in public.  Follow Up Instructions: 6 weeks fasting labs- Lipids, hepatic fx panel   I discussed the assessment and treatment plan with the patient. The patient was provided an opportunity to ask questions and all were answered. The patient agreed with the plan and demonstrated an understanding of the instructions.   The patient was advised to call back or seek an in-person evaluation if the symptoms worsen or if the condition fails to improve as anticipated.  I provided  12  minutes of non-face-to-face time during this encounter.   , NP

## 2019-11-21 ENCOUNTER — Encounter: Payer: Self-pay | Admitting: Adult Health

## 2019-11-21 ENCOUNTER — Ambulatory Visit (INDEPENDENT_AMBULATORY_CARE_PROVIDER_SITE_OTHER): Payer: No Typology Code available for payment source | Admitting: Adult Health

## 2019-11-21 ENCOUNTER — Other Ambulatory Visit: Payer: Self-pay

## 2019-11-21 VITALS — Wt 247.0 lb

## 2019-11-21 DIAGNOSIS — E785 Hyperlipidemia, unspecified: Secondary | ICD-10-CM

## 2019-11-21 DIAGNOSIS — I10 Essential (primary) hypertension: Secondary | ICD-10-CM

## 2019-11-21 DIAGNOSIS — Z79899 Other long term (current) drug therapy: Secondary | ICD-10-CM | POA: Diagnosis not present

## 2019-11-21 DIAGNOSIS — F17200 Nicotine dependence, unspecified, uncomplicated: Secondary | ICD-10-CM

## 2019-11-21 MED ORDER — ROSUVASTATIN CALCIUM 10 MG PO TABS: ORAL_TABLET | ORAL | 0 refills | Status: DC

## 2019-11-21 NOTE — Assessment & Plan Note (Signed)
She continues to use tobacco- 1/2 pack per day-she was recently started on Wellbutrin SR 150 mg, has titrated up to BID. She has reduced from a pack/day- great job!

## 2019-11-21 NOTE — Assessment & Plan Note (Signed)
She is currently Lisinopril 10mg  QD Ambulatory BP readings- SBP 120s DBP 80s She denies acute cardiac sx's.

## 2019-11-21 NOTE — Assessment & Plan Note (Signed)
Assessment and Plan: LDL increased >35 pts in last year She is agreeable to statin therapy 3 times per week- Rosuvastatin 10mg  CMP stable Continue to reduce tobacco use- she has titrated up on Wellbutrin SR 150mg  BID Remain well hydrated, continue meal prep- hearty healthy diet, increase daily walking. Continue to social distance and wear a mask when in public.  Follow Up Instructions: 6 weeks fasting labs- Lipids, hepatic fx panel   I discussed the assessment and treatment plan with the patient. The patient was provided an opportunity to ask questions and all were answered. The patient agreed with the plan and demonstrated an understanding of the instructions.   The patient was advised to call back or seek an in-person evaluation if the symptoms worsen or if the condition fails to improve as anticipated.

## 2019-12-28 ENCOUNTER — Other Ambulatory Visit: Payer: Self-pay | Admitting: Family Medicine

## 2019-12-28 DIAGNOSIS — I10 Essential (primary) hypertension: Secondary | ICD-10-CM

## 2019-12-28 DIAGNOSIS — Z79899 Other long term (current) drug therapy: Secondary | ICD-10-CM

## 2019-12-28 DIAGNOSIS — E785 Hyperlipidemia, unspecified: Secondary | ICD-10-CM

## 2019-12-28 NOTE — Progress Notes (Signed)
Ordering provider leaving practice. Labs being reordered under physician providing care. AS, CMA

## 2020-01-03 ENCOUNTER — Other Ambulatory Visit: Payer: No Typology Code available for payment source

## 2020-03-29 NOTE — Progress Notes (Signed)
PCP:  Patient, No Pcp Per   Chief Complaint  Patient presents with  . Gynecologic Exam  . Amenorrhea    pt usually gets her cycles every month, no period April and May so far     HPI:      Sylvia Warren is a 39 y.o. G3P3 whose LMP was Patient's last menstrual period was 02/06/2020 (approximate)., presents today for her NP> 3 yrs annual examination.  Her menses are usually regular every 28-30 days, lasting 6-7 days.  Dysmenorrhea mild, occurring premenstrually. She does not have intermenstrual bleeding. Missed April and May menses. No increased stress/ wt changes/sickness. S/p endometrial ablation 2012 but still has light to moderate flow monthly.   Sex activity: single partner, contraception - tubal ligation.  Last Pap: 11/03/16  Results were: no abnormalities /neg HPV DNA 2015  Last mammogram: not recent There is no FH of breast cancer. There is no FH of ovarian cancer. The patient does do self-breast exams.  Tobacco use: The patient currently smokes 1 packs of cigarettes per day for the past many years. Alcohol use: none No drug use.  Exercise: moderately active  She does get adequate calcium and Vitamin D in her diet. Labs with PCP. Has pre-DM. Trying diet/wt loss changes.   Past Medical History:  Diagnosis Date  . ADHD   . COVID-19   . Hypertension   . Lumbar herniated disc   . Migraine   . Pain   . Sciatica     Past Surgical History:  Procedure Laterality Date  . CESAREAN SECTION    . HYSTEROSCOPY    . KNEE ARTHROSCOPY    . TUBAL LIGATION      Family History  Problem Relation Age of Onset  . Hypertension Mother   . Cirrhosis Mother   . Kidney disease Mother   . Obesity Mother   . Healthy Sister   . Healthy Daughter   . Healthy Son   . Cancer Maternal Aunt        vulvar  . Diabetes Paternal Grandmother   . Healthy Sister   . Healthy Sister   . Healthy Son   . Uterine cancer Other 35    Social History   Socioeconomic History  .  Marital status: Married    Spouse name: Glee Arvin  . Number of children: 3  . Years of education: Not on file  . Highest education level: Bachelor's degree (e.g., BA, AB, BS)  Occupational History  . Occupation: Charity fundraiser  Tobacco Use  . Smoking status: Current Every Day Smoker    Packs/day: 1.00    Years: 18.00    Pack years: 18.00    Types: Cigarettes  . Smokeless tobacco: Never Used  Substance and Sexual Activity  . Alcohol use: No  . Drug use: No  . Sexual activity: Yes    Birth control/protection: Surgical    Comment: Tubal ligation  Other Topics Concern  . Not on file  Social History Narrative   Lives at home with her husband    Caffeine: 1 soda 5 days per week, coffee x 2 daily    Social Determinants of Health   Financial Resource Strain:   . Difficulty of Paying Living Expenses:   Food Insecurity:   . Worried About Programme researcher, broadcasting/film/video in the Last Year:   . Barista in the Last Year:   Transportation Needs:   . Freight forwarder (Medical):   Marland Kitchen Lack of  Transportation (Non-Medical):   Physical Activity:   . Days of Exercise per Week:   . Minutes of Exercise per Session:   Stress:   . Feeling of Stress :   Social Connections:   . Frequency of Communication with Friends and Family:   . Frequency of Social Gatherings with Friends and Family:   . Attends Religious Services:   . Active Member of Clubs or Organizations:   . Attends Banker Meetings:   Marland Kitchen Marital Status:   Intimate Partner Violence:   . Fear of Current or Ex-Partner:   . Emotionally Abused:   Marland Kitchen Physically Abused:   . Sexually Abused:      Current Outpatient Medications:  .  cephALEXin (KEFLEX) 500 MG capsule, Take 500 mg by mouth 3 (three) times daily., Disp: , Rfl:  .  ibuprofen (ADVIL,MOTRIN) 200 MG tablet, Take 400 mg by mouth every 6 (six) hours as needed., Disp: , Rfl:  .  lisinopril (ZESTRIL) 10 MG tablet, Take 1 tablet (10 mg total) by mouth daily., Disp: 90  tablet, Rfl: 1 .  rizatriptan (MAXALT) 5 MG tablet, Take 1 tablet (5 mg total) by mouth as needed for migraine. May repeat in 2 hours if needed, Disp: 10 tablet, Rfl: 1 .  medroxyPROGESTERone (PROVERA) 10 MG tablet, Take 1 tablet (10 mg total) by mouth daily for 7 days., Disp: 7 tablet, Rfl: 0     ROS:  Review of Systems  Constitutional: Negative for fatigue, fever and unexpected weight change.  Respiratory: Negative for cough, shortness of breath and wheezing.   Cardiovascular: Negative for chest pain, palpitations and leg swelling.  Gastrointestinal: Negative for blood in stool, constipation, diarrhea, nausea and vomiting.  Endocrine: Negative for cold intolerance, heat intolerance and polyuria.  Genitourinary: Negative for dyspareunia, dysuria, flank pain, frequency, genital sores, hematuria, menstrual problem, pelvic pain, urgency, vaginal bleeding, vaginal discharge and vaginal pain.  Musculoskeletal: Negative for back pain, joint swelling and myalgias.  Skin: Negative for rash.  Neurological: Negative for dizziness, syncope, light-headedness, numbness and headaches.  Hematological: Negative for adenopathy.  Psychiatric/Behavioral: Negative for agitation, confusion, sleep disturbance and suicidal ideas. The patient is not nervous/anxious.    BREAST: No symptoms   Objective: BP 134/82   Ht 5' 7.5" (1.715 m)   Wt 250 lb (113.4 kg)   LMP 02/06/2020 (Approximate)   BMI 38.58 kg/m    Physical Exam Constitutional:      Appearance: She is well-developed.  Genitourinary:     Vulva, vagina, cervix, uterus, right adnexa and left adnexa normal.     No vulval lesion or tenderness noted.     No vaginal discharge, erythema or tenderness.     No cervical polyp.     Uterus is not enlarged or tender.     No right or left adnexal mass present.     Right adnexa not tender.     Left adnexa not tender.  Neck:     Thyroid: No thyromegaly.  Cardiovascular:     Rate and Rhythm: Normal  rate and regular rhythm.     Heart sounds: Normal heart sounds. No murmur.  Pulmonary:     Effort: Pulmonary effort is normal.     Breath sounds: Normal breath sounds.  Chest:     Breasts:        Right: No mass, nipple discharge, skin change or tenderness.        Left: No mass, nipple discharge, skin change or tenderness.  Abdominal:  Palpations: Abdomen is soft.     Tenderness: There is no abdominal tenderness. There is no guarding.  Musculoskeletal:        General: Normal range of motion.     Cervical back: Normal range of motion.  Neurological:     General: No focal deficit present.     Mental Status: She is alert and oriented to person, place, and time.     Cranial Nerves: No cranial nerve deficit.  Skin:    General: Skin is warm and dry.  Psychiatric:        Mood and Affect: Mood normal.        Behavior: Behavior normal.        Thought Content: Thought content normal.        Judgment: Judgment normal.  Vitals reviewed.     Results: Results for orders placed or performed in visit on 04/02/20 (from the past 24 hour(s))  POCT urine pregnancy     Status: Normal   Collection Time: 04/02/20  8:53 AM  Result Value Ref Range   Preg Test, Ur Negative Negative    Assessment/Plan: Encounter for annual routine gynecological examination  Cervical cancer screening - Plan: Cytology - PAP  Screening for HPV (human papillomavirus) - Plan: Cytology - PAP  Encounter for screening mammogram for malignant neoplasm of breast - Plan: MM 3D SCREEN BREAST BILATERAL; pt to sched mammo  Late menses - Plan: medroxyPROGESTERone (PROVERA) 10 MG tablet, POCT urine pregnancy, Neg UPT. Rx provera. Pt to f/u after use. If no bleeding, will eval further. May "reset" cycles.   Meds ordered this encounter  Medications  . medroxyPROGESTERone (PROVERA) 10 MG tablet    Sig: Take 1 tablet (10 mg total) by mouth daily for 7 days.    Dispense:  7 tablet    Refill:  0    Order Specific Question:    Supervising Provider    Answer:   Gae Dry [[id number]]             GYN counsel breast self exam, mammography screening, adequate intake of calcium and vitamin D, diet and exercise     F/U  Return in about 1 year (around 04/02/2021).  Kewan Mcnease B. Payson Evrard, PA-C 04/02/2020 8:59 AM

## 2020-04-02 ENCOUNTER — Encounter: Payer: Self-pay | Admitting: Obstetrics and Gynecology

## 2020-04-02 ENCOUNTER — Other Ambulatory Visit: Payer: Self-pay

## 2020-04-02 ENCOUNTER — Ambulatory Visit (INDEPENDENT_AMBULATORY_CARE_PROVIDER_SITE_OTHER): Payer: No Typology Code available for payment source | Admitting: Obstetrics and Gynecology

## 2020-04-02 ENCOUNTER — Other Ambulatory Visit (HOSPITAL_COMMUNITY)
Admission: RE | Admit: 2020-04-02 | Discharge: 2020-04-02 | Disposition: A | Discharge status: Home / Self Care | Payer: No Typology Code available for payment source | Source: Ambulatory Visit | Attending: Obstetrics and Gynecology | Admitting: Obstetrics and Gynecology

## 2020-04-02 VITALS — BP 134/82 | Ht 67.5 in | Wt 250.0 lb

## 2020-04-02 DIAGNOSIS — Z1151 Encounter for screening for human papillomavirus (HPV): Secondary | ICD-10-CM | POA: Diagnosis present

## 2020-04-02 DIAGNOSIS — Z01419 Encounter for gynecological examination (general) (routine) without abnormal findings: Secondary | ICD-10-CM

## 2020-04-02 DIAGNOSIS — Z124 Encounter for screening for malignant neoplasm of cervix: Secondary | ICD-10-CM | POA: Diagnosis not present

## 2020-04-02 DIAGNOSIS — Z1231 Encounter for screening mammogram for malignant neoplasm of breast: Secondary | ICD-10-CM

## 2020-04-02 DIAGNOSIS — N926 Irregular menstruation, unspecified: Secondary | ICD-10-CM | POA: Diagnosis not present

## 2020-04-02 LAB — POCT URINE PREGNANCY: Preg Test, Ur: NEGATIVE

## 2020-04-02 MED ORDER — MEDROXYPROGESTERONE ACETATE 10 MG PO TABS: 10.0000 mg | ORAL_TABLET | Freq: Every day | ORAL | 0 refills | Status: DC

## 2020-04-02 NOTE — Patient Instructions (Signed)
I value your feedback and entrusting us with your care. If you get a [facility] patient survey, I would appreciate you taking the time to let us know about your experience today. Thank you!  As of October 27, 2019, your lab results will be released to your MyChart immediately, before I even have a chance to see them. Please give me time to review them and contact you if there are any abnormalities. Thank you for your patience.   [facility]: [phone]  [facility]: [phone]  

## 2020-04-04 LAB — CYTOLOGY - PAP
Comment: NEGATIVE
Diagnosis: NEGATIVE
High risk HPV: NEGATIVE

## 2020-04-19 ENCOUNTER — Encounter: Payer: Self-pay | Admitting: Obstetrics and Gynecology

## 2020-05-01 ENCOUNTER — Telehealth: Payer: Self-pay | Admitting: Physician Assistant

## 2020-05-01 DIAGNOSIS — I10 Essential (primary) hypertension: Secondary | ICD-10-CM

## 2020-05-01 DIAGNOSIS — Z0000 Encounter for general adult medical examination without abnormal findings: Secondary | ICD-10-CM

## 2020-05-01 DIAGNOSIS — R7309 Other abnormal glucose: Secondary | ICD-10-CM

## 2020-05-01 DIAGNOSIS — E785 Hyperlipidemia, unspecified: Secondary | ICD-10-CM

## 2020-05-01 NOTE — Telephone Encounter (Signed)
Future labs placed. AS, CMA 

## 2020-05-01 NOTE — Telephone Encounter (Signed)
Patient called seek OV & to reschedule cancelled Lab Appt from 01/03/20  -----See lab order were under Dr.Opalski , forwarding note to med asst for review & to place under current provider.  --glh

## 2020-05-03 ENCOUNTER — Other Ambulatory Visit: Payer: No Typology Code available for payment source

## 2020-05-16 ENCOUNTER — Telehealth (INDEPENDENT_AMBULATORY_CARE_PROVIDER_SITE_OTHER): Payer: No Typology Code available for payment source | Admitting: Medical-Surgical

## 2020-05-16 ENCOUNTER — Other Ambulatory Visit: Payer: No Typology Code available for payment source

## 2020-05-16 ENCOUNTER — Encounter: Payer: Self-pay | Admitting: Medical-Surgical

## 2020-05-16 ENCOUNTER — Other Ambulatory Visit: Payer: Self-pay

## 2020-05-16 VITALS — BP 147/93 | HR 68 | Temp 98.1°F | Ht 67.5 in | Wt 247.2 lb

## 2020-05-16 DIAGNOSIS — Z0000 Encounter for general adult medical examination without abnormal findings: Secondary | ICD-10-CM

## 2020-05-16 DIAGNOSIS — J029 Acute pharyngitis, unspecified: Secondary | ICD-10-CM | POA: Diagnosis not present

## 2020-05-16 DIAGNOSIS — I10 Essential (primary) hypertension: Secondary | ICD-10-CM

## 2020-05-16 DIAGNOSIS — E785 Hyperlipidemia, unspecified: Secondary | ICD-10-CM

## 2020-05-16 DIAGNOSIS — R7309 Other abnormal glucose: Secondary | ICD-10-CM

## 2020-05-16 LAB — POCT RAPID STREP A (OFFICE): Rapid Strep A Screen: NEGATIVE

## 2020-05-16 NOTE — Progress Notes (Signed)
Virtual Visit via Video Note  I connected with Sylvia Warren on 05/16/20 at 11:10 AM EDT by a video enabled telemedicine application and verified that I am speaking with the correct person using two identifiers.   I discussed the limitations of evaluation and management by telemedicine and the availability of in person appointments. The patient expressed understanding and agreed to proceed.  Patient location: home Provider locations: office  Subjective:    CC: sore throat  HPI: Pleasant 39 year old female presenting today with complaints of sore throat that started yesterday.  She notes that her throat is burning and she feels as if there is glass in there.  She also has a scratchy feeling that is causing her to cough.  Cough is nonproductive.  No fever, chills, sinus congestion, sneezing, chest congestion, ear pain/pressure, and GI symptoms.  She tried a mouth rinse that had a lidocaine in it that her daughter had, reports this was awful and she was unable to tolerate that.  She has tried OTC cold/flu/sore throat medication which helps at night but not during the day.  Notes that her child's daycare has had strep and RSV going around.  Able to eat and drink well.  Past medical history, Surgical history, Family history not pertinant except as noted below, Social history, Allergies, and medications have been entered into the medical record, reviewed, and corrections made.   Review of Systems: See HPI for pertinent positives and negatives.   Objective:    General: Speaking clearly in complete sentences without any shortness of breath.  Alert and oriented x3.  Normal judgment. No apparent acute distress.  Impression and Recommendations:    You Strep swab performed at [facility] was negative.  Given presenting symptoms and timeline, suspect this is viral pharyngitis.  May continue taking Tylenol and ibuprofen as needed.  Discussed over-the-counter remedies including Chloraseptic, Cepacol  lozenges, and cold/flu preparations.  Advised to try warm liquids such as tea with honey, coffee, or hot chocolate.  If this is not helpful may consider trying cold fluids such as popsicles and ice cream.  As she is unwilling to use a mouth rinse with lidocaine, no prescriptions at this time to the pharmacy.  No follow-ups on file.  20 minutes of non-face-to-face time was provided during this encounter.   I discussed the assessment and treatment plan with the patient. The patient was provided an opportunity to ask questions and all were answered. The patient agreed with the plan and demonstrated an understanding of the instructions.   The patient was advised to call back or seek an in-person evaluation if the symptoms worsen or if the condition fails to improve as anticipated.  Thayer Ohm, DNP, APRN, FNP-BC [facility]

## 2020-05-17 LAB — CBC
Hematocrit: 44.1 % (ref 34.0–46.6)
Hemoglobin: 14.7 g/dL (ref 11.1–15.9)
MCH: 29.7 pg (ref 26.6–33.0)
MCHC: 33.3 g/dL (ref 31.5–35.7)
MCV: 89 fL (ref 79–97)
Platelets: 312 10*3/uL (ref 150–450)
RBC: 4.95 x10E6/uL (ref 3.77–5.28)
RDW: 13.6 % (ref 11.7–15.4)
WBC: 7.2 10*3/uL (ref 3.4–10.8)

## 2020-05-17 LAB — HEMOGLOBIN A1C
Est. average glucose Bld gHb Est-mCnc: 117 mg/dL
Hgb A1c MFr Bld: 5.7 % — ABNORMAL HIGH (ref 4.8–5.6)

## 2020-05-17 LAB — COMPREHENSIVE METABOLIC PANEL
ALT: 31 IU/L (ref 0–32)
AST: 22 IU/L (ref 0–40)
Albumin/Globulin Ratio: 1.4 (ref 1.2–2.2)
Albumin: 4 g/dL (ref 3.8–4.8)
Alkaline Phosphatase: 75 IU/L (ref 48–121)
BUN/Creatinine Ratio: 15 (ref 9–23)
BUN: 9 mg/dL (ref 6–24)
Bilirubin Total: 0.3 mg/dL (ref 0.0–1.2)
CO2: 23 mmol/L (ref 20–29)
Calcium: 9.5 mg/dL (ref 8.7–10.2)
Chloride: 101 mmol/L (ref 96–106)
Creatinine, Ser: 0.59 mg/dL (ref 0.57–1.00)
GFR calc Af Amer: 132 mL/min/{1.73_m2} (ref >59)
GFR calc non Af Amer: 114 mL/min/{1.73_m2} (ref >59)
Globulin, Total: 2.8 g/dL (ref 1.5–4.5)
Glucose: 87 mg/dL (ref 65–99)
Potassium: 4.9 mmol/L (ref 3.5–5.2)
Sodium: 137 mmol/L (ref 134–144)
Total Protein: 6.8 g/dL (ref 6.0–8.5)

## 2020-05-17 LAB — LIPID PANEL
Chol/HDL Ratio: 4.5 ratio — ABNORMAL HIGH (ref 0.0–4.4)
Cholesterol, Total: 221 mg/dL — ABNORMAL HIGH (ref 100–199)
HDL: 49 mg/dL (ref >39)
LDL Chol Calc (NIH): 145 mg/dL — ABNORMAL HIGH (ref 0–99)
Triglycerides: 152 mg/dL — ABNORMAL HIGH (ref 0–149)
VLDL Cholesterol Cal: 27 mg/dL (ref 5–40)

## 2020-05-17 LAB — TSH: TSH: 2.16 u[IU]/mL (ref 0.450–4.500)

## 2020-05-23 ENCOUNTER — Telehealth: Payer: Self-pay

## 2020-05-24 NOTE — Telephone Encounter (Signed)
Spoke with pt via telephone, who states that she did not intend to include the derogatory remark in her MyChart message.  This was a typo with her smartphone.    Pt states that she wishes to wait until her follow up appt with Maritza to discuss meds, as she has not been taking the rosuvastatin.  Tiajuana Amass, CMA

## 2020-06-21 ENCOUNTER — Other Ambulatory Visit: Payer: Self-pay | Admitting: Physician Assistant

## 2020-06-21 ENCOUNTER — Other Ambulatory Visit: Payer: Self-pay

## 2020-06-21 ENCOUNTER — Encounter: Payer: Self-pay | Admitting: Physician Assistant

## 2020-06-21 ENCOUNTER — Ambulatory Visit (INDEPENDENT_AMBULATORY_CARE_PROVIDER_SITE_OTHER): Payer: No Typology Code available for payment source | Admitting: Physician Assistant

## 2020-06-21 DIAGNOSIS — Z6837 Body mass index (BMI) 37.0-37.9, adult: Secondary | ICD-10-CM

## 2020-06-21 DIAGNOSIS — E785 Hyperlipidemia, unspecified: Secondary | ICD-10-CM

## 2020-06-21 DIAGNOSIS — I10 Essential (primary) hypertension: Secondary | ICD-10-CM

## 2020-06-21 DIAGNOSIS — E669 Obesity, unspecified: Secondary | ICD-10-CM | POA: Diagnosis not present

## 2020-06-21 DIAGNOSIS — G43901 Migraine, unspecified, not intractable, with status migrainosus: Secondary | ICD-10-CM | POA: Diagnosis not present

## 2020-06-21 MED ORDER — ROSUVASTATIN CALCIUM 10 MG PO TABS: ORAL_TABLET | ORAL | 1 refills | Status: AC

## 2020-06-21 MED ORDER — SAXENDA 18 MG/3ML ~~LOC~~ SOPN: PEN_INJECTOR | SUBCUTANEOUS | 1 refills | Status: DC

## 2020-06-21 MED ORDER — RIZATRIPTAN BENZOATE 5 MG PO TABS: 5.0000 mg | ORAL_TABLET | ORAL | 1 refills | Status: AC | PRN

## 2020-06-21 MED ORDER — LISINOPRIL 10 MG PO TABS: 10.0000 mg | ORAL_TABLET | Freq: Every day | ORAL | 1 refills | Status: DC

## 2020-06-21 NOTE — Progress Notes (Signed)
Telehealth office visit note for Sylvia Masker, PA-C- at [facility] at [facility]   I connected with current patient today by telephone and verified that I am speaking with the correct person   . Location of the patient: Home . Location of the provider: Office - This visit type was conducted due to national recommendations for restrictions regarding the COVID-19 Pandemic (e.g. social distancing) in an effort to limit this patient's exposure and mitigate transmission in our community.    - No physical exam could be performed with this format, beyond that communicated to [place] by the patient/ family members as noted.   - Additionally my office staff/ schedulers were to discuss with the patient that there may be a monetary charge related to this service, depending on their medical insurance.  My understanding is that patient understood and consented to proceed.     _________________________________________________________________________________   History of Present Illness: Pt calls in to follow-up on  Hyperlipidemia, hypertension, and migraines.  HTN: Pt denies chest pain, palpitations, dizziness or lower extremity swelling. Taking medication as directed without side effects. Doesn't check blood pressure at home.  HLD: States she wasn't aware she was suppose to be on statin therapy. Reports no medication was sent in.   Migraines: Stable and under control. She takes Maxalt for when she gets a migraine, which isn't often.  Weight loss management: Pt is inquiring about treatment options for weight loss. States she was going to [facility] but was unable to continue due to cost. She is asking about Ozempic.  No flowsheet data found.  Depression screen [facility] 2/9 06/21/2020 11/21/2019 10/31/2019 05/12/2019 11/22/2018  Decreased Interest 0 0 1 0 2  Down, Depressed, Hopeless 0 0 0 0 1  PHQ - 2 Score 0 0 1 0 3  Altered sleeping 0 0 1 0 1  Tired, decreased energy 0 0 1 1 3   Change  in appetite 0 0 0 0 1  Feeling bad or failure about yourself  0 0 0 0 0  Trouble concentrating 0 0 0 0 2  Moving slowly or fidgety/restless 0 0 0 0 0  Suicidal thoughts 0 0 0 0 0  PHQ-9 Score 0 0 3 1 10   Difficult doing work/chores - Not difficult at all Not difficult at all Not difficult at all Not difficult at all  Some recent data might be hidden      Impression and Recommendations:     1. HTN, goal below 130/80   2. Migraine with status migrainosus, not intractable, unspecified migraine type   3. Class 2 obesity with body mass index (BMI) of 37.0 to 37.9 in adult, unspecified obesity type, unspecified whether serious comorbidity present   4. Hyperlipidemia, unspecified hyperlipidemia type     HTN: - Unable to obtain BP today. Discussed starting ambulatory BP and pulse monitoring to determine if medication regimen needs to be adjusted. Per chart review, last BP reading elevated at 147/93 HR 68. - Continue current medication regimen. Provided refill. - Follow DASH diet. - Encourage to stay as active as possible.  HLD: - Last lipid panel: total cholesterol, triglycerides, and bad cholesterol elevated - Pt agreeable to starting statin therapy 3 times weekly as previously discussed. - Plan to recheck lipid panel and hepatic function in 6 weeks. - Follow heart healthy diet.  Class 2 obesity: -Discussed with patient various treatment options for weight loss and agreeable to starting Saxenda. Pt has no history of thyroid  disease/cancer. -Discussed titration of medication. Pt verbalized understanding. -Recommend to continue with dietary and lifestyle changes and use my fitness pal or lose it app. -Follow up in 4 weeks.  Migraine:  -Stable -Provided refill of Imitrex.     - As part of my medical decision making, I reviewed the following data within the electronic MEDICAL RECORD NUMBER History obtained from pt /family, CMA notes reviewed and incorporated if applicable, Labs reviewed,  Radiograph/ tests reviewed if applicable and OV notes from prior OV's with me, as well as any other specialists she/he has seen since seeing me last, were all reviewed and used in my medical decision making process today.    - Additionally, when appropriate, discussion had with patient regarding our treatment plan, and their biases/concerns about that plan were used in my medical decision making today.    - The patient agreed with the plan and demonstrated an understanding of the instructions.   No barriers to understanding were identified.     - The patient was advised to call back or seek an in-person evaluation if the symptoms worsen or if the condition fails to improve as anticipated.   No follow-ups on file.    No orders of the defined types were placed in this encounter.   Meds ordered this encounter  Medications  . Liraglutide -Weight Management (SAXENDA) 18 MG/3ML SOPN    Sig: Inject 0.6 mg subcutaneously once daily x 1 week. Then increase dose by 0.6 mg/day every 7 days until you reach dose of 3 mg once daily.    Dispense:  9 mL    Refill:  1    Order Specific Question:   Supervising Provider    Answer:   Nani Gasser D [[id number]]  . rosuvastatin (CRESTOR) 10 MG tablet    Sig: Take 1 tablet by mouth three times weekly at bedtime.    Dispense:  90 tablet    Refill:  1    Order Specific Question:   Supervising Provider    Answer:   Nani Gasser D [[id number]]  . lisinopril (ZESTRIL) 10 MG tablet    Sig: Take 1 tablet (10 mg total) by mouth daily.    Dispense:  90 tablet    Refill:  1    Order Specific Question:   Supervising Provider    Answer:   Nani Gasser D [[id number]]  . rizatriptan (MAXALT) 5 MG tablet    Sig: Take 1 tablet (5 mg total) by mouth as needed for migraine. May repeat in 2 hours if needed    Dispense:  10 tablet    Refill:  1    Order Specific Question:   Supervising Provider    Answer:   Nani Gasser D [[id number]]    Medications  Discontinued During This Encounter  Medication Reason  . lisinopril (ZESTRIL) 10 MG tablet Reorder  . rizatriptan (MAXALT) 5 MG tablet Reorder       Time spent on visit including pre-visit chart review and post-visit care was 15 minutes.      The 21st Century Cures Act was signed into law in 2016 which includes the topic of electronic health records.  This provides immediate access to information in MyChart.  This includes consultation notes, operative notes, office notes, lab results and pathology reports.  If you have any questions about what you read please let us know at your next visit or call us at the office.  We are right here with you.   __________________________________________________________________________________  Patient Care Team    Relationship Specialty Notifications Start End  Sylvia Warren, [place] PCP - General Physician Assistant  05/01/20   [facility]    12/16/17   [facility]  12/17/17      -Vitals obtained; medications/ allergies reconciled;  personal medical, social, Sx etc.histories were updated by CMA, reviewed by me and are reflected in chart   Patient Active Problem List   Diagnosis Date Noted  . Hyperlipidemia 11/21/2019  . BMI 40.0-44.9, adult (HCC) 05/12/2019  . Cough 02/03/2019  . Fever 02/03/2019  . Sciatica of left side 10/26/2018  . Other complicated headache syndrome 07/12/2018  . Great toe pain, left 06/16/2018  . RUQ pain 05/03/2018  . Vitamin D deficiency 02/10/2018  . Migraine with status migrainosus, not intractable 02/10/2018  . HTN, goal below 130/80 12/16/2017  . Tobacco use disorder 12/16/2017  . Healthcare maintenance 12/16/2017     Current Meds  Medication Sig  . ibuprofen (ADVIL,MOTRIN) 200 MG tablet Take 400 mg by mouth every 6 (six) hours as needed.  Marland Kitchen lisinopril (ZESTRIL) 10 MG tablet Take 1 tablet (10 mg total) by mouth daily.  . rizatriptan (MAXALT) 5 MG tablet Take 1  tablet (5 mg total) by mouth as needed for migraine. May repeat in 2 hours if needed  . [DISCONTINUED] lisinopril (ZESTRIL) 10 MG tablet Take 1 tablet (10 mg total) by mouth daily.  . [DISCONTINUED] rizatriptan (MAXALT) 5 MG tablet Take 1 tablet (5 mg total) by mouth as needed for migraine. May repeat in 2 hours if needed     Allergies:  No Known Allergies   ROS:  See above HPI for pertinent positives and negatives   Objective:   Height 5\' 7"  (1.702 m), weight 241 lb 12.8 oz (109.7 kg), last menstrual period 06/01/2020.  (if some vitals are omitted, this means that patient was UNABLE to obtain them even though they were asked to get them prior to OV today.  They were asked to call 06/03/2020 at their earliest convenience with these once obtained. ) General: A & O * 3; sounds in no acute distress; in usual state of health.  Skin: Pt confirms warm and dry extremities and pink fingertips HEENT: Pt confirms lips non-cyanotic Chest: Patient confirms normal chest excursion and movement Respiratory: speaking in full sentences, no conversational dyspnea; patient confirms no use of accessory muscles Psych: insight appears good, mood- appears full

## 2020-06-28 ENCOUNTER — Encounter: Payer: Self-pay | Admitting: Physician Assistant

## 2020-11-02 ENCOUNTER — Other Ambulatory Visit: Payer: Self-pay

## 2020-11-02 ENCOUNTER — Other Ambulatory Visit: Payer: Self-pay | Admitting: Physician Assistant

## 2020-11-02 ENCOUNTER — Ambulatory Visit (INDEPENDENT_AMBULATORY_CARE_PROVIDER_SITE_OTHER): Payer: No Typology Code available for payment source | Admitting: Obstetrics and Gynecology

## 2020-11-02 ENCOUNTER — Other Ambulatory Visit: Payer: Self-pay | Admitting: Obstetrics and Gynecology

## 2020-11-02 ENCOUNTER — Other Ambulatory Visit (HOSPITAL_COMMUNITY)
Admission: RE | Admit: 2020-11-02 | Discharge: 2020-11-02 | Disposition: A | Discharge status: Home / Self Care | Payer: No Typology Code available for payment source | Source: Ambulatory Visit | Attending: Obstetrics and Gynecology | Admitting: Obstetrics and Gynecology

## 2020-11-02 ENCOUNTER — Encounter: Payer: Self-pay | Admitting: Obstetrics and Gynecology

## 2020-11-02 VITALS — BP 128/88 | Ht 67.0 in | Wt 223.0 lb

## 2020-11-02 DIAGNOSIS — N914 Secondary oligomenorrhea: Secondary | ICD-10-CM | POA: Diagnosis not present

## 2020-11-02 DIAGNOSIS — Z113 Encounter for screening for infections with a predominantly sexual mode of transmission: Secondary | ICD-10-CM | POA: Insufficient documentation

## 2020-11-02 DIAGNOSIS — A5901 Trichomonal vulvovaginitis: Secondary | ICD-10-CM | POA: Diagnosis not present

## 2020-11-02 DIAGNOSIS — E669 Obesity, unspecified: Secondary | ICD-10-CM

## 2020-11-02 MED ORDER — MEDROXYPROGESTERONE ACETATE 10 MG PO TABS: 10.0000 mg | ORAL_TABLET | Freq: Every day | ORAL | 4 refills | Status: DC

## 2020-11-02 MED ORDER — METRONIDAZOLE 500 MG PO TABS: 2000.0000 mg | ORAL_TABLET | ORAL | 0 refills | Status: DC

## 2020-11-02 NOTE — Progress Notes (Signed)
Obstetrics & Gynecology Office Visit   Chief Complaint  Patient presents with   Vaginitis   History of Present Illness: [name] is a 39 y.o. G3P3 who LMP was No LMP recorded. (Menstrual status: Irregular Periods)., presents today for a problem visit.   Patient complains of an abnormal vaginal discharge for 2 weeks. Discharge described as: normal and physiologic. Vaginal symptoms include odor and vulvar itching.Vulvar symptoms include vulvar itching.STI Risk: unknown - she is OK with STD testing.   Other associated symptoms: none.Menstrual pattern: She had been bleeding irregularly. Contraception: tubal ligation.  She denies recent antibiotic exposure, denies changes in soaps, detergents coinciding with the onset of her symptoms.  She has previously self treated or been under treatment by another provider for these symptoms.  She tried OTC medication for yeast infection.  She also has skipped a period. She has had to take Provera once before to stimulate a period.  Past Medical History:  Diagnosis Date   ADHD    COVID-19    Hypertension    Lumbar herniated disc    Migraine    Pain    Sciatica     Past Surgical History:  Procedure Laterality Date   CESAREAN SECTION     HYSTEROSCOPY     KNEE ARTHROSCOPY     TUBAL LIGATION      Gynecologic History: No LMP recorded. (Menstrual status: Irregular Periods).  Obstetric History: G3P3  Family History  Problem Relation Age of Onset   Hypertension Mother    Cirrhosis Mother    Kidney disease Mother    Obesity Mother    Healthy Sister    Healthy Daughter    Healthy Son    Cancer Maternal Aunt        vulvar   Diabetes Paternal Grandmother    Healthy Sister    Healthy Sister    Healthy Son    Uterine cancer Other 35    Social History   Socioeconomic History   Marital status: Married    Spouse name: Sylvia Warren   Number of children: 3   Years of education: Not on file    Highest education level: Bachelor's degree (e.g., BA, AB, BS)  Occupational History   Occupation: RN  Tobacco Use   Smoking status: Current Every Day Smoker    Packs/day: 1.00    Years: 18.00    Pack years: 18.00    Types: Cigarettes   Smokeless tobacco: Never Used  Vaping Use   Vaping Use: Never used  Substance and Sexual Activity   Alcohol use: No   Drug use: No   Sexual activity: Yes    Birth control/protection: Surgical    Comment: Tubal ligation  Other Topics Concern   Not on file  Social History Narrative   Lives at home with her husband    Caffeine: 1 soda 5 days per week, coffee x 2 daily    Social Determinants of Health   Financial Resource Strain: Not on file  Food Insecurity: Not on file  Transportation Needs: Not on file  Physical Activity: Not on file  Stress: Not on file  Social Connections: Not on file  Intimate Partner Violence: Not on file    No Known Allergies  Prior to Admission medications   Medication Sig Start Date End Date Taking? Authorizing Provider  Liraglutide -Weight Management (SAXENDA) 18 MG/3ML SOPN Inject 0.6 mg subcutaneously once daily x 1 week. Then increase dose by 0.6 mg/day every 7 days until  you reach dose of 3 mg once daily. 06/21/20  Yes Abonza, Maritza, PA-C  lisinopril (ZESTRIL) 10 MG tablet Take 1 tablet (10 mg total) by mouth daily. 06/21/20  Yes Abonza, Maritza, PA-C  rizatriptan (MAXALT) 5 MG tablet Take 1 tablet (5 mg total) by mouth as needed for migraine. May repeat in 2 hours if needed 06/21/20  Yes Abonza, Maritza, PA-C  ibuprofen (ADVIL,MOTRIN) 200 MG tablet Take 400 mg by mouth every 6 (six) hours as needed. Patient not taking: Reported on 11/02/2020    [provider]  medroxyPROGESTERone (PROVERA) 10 MG tablet Take 1 tablet (10 mg total) by mouth daily for 7 days. 04/02/20 04/09/20  Copland, Ilona Sorrel, PA-C  rosuvastatin (CRESTOR) 10 MG tablet Take 1 tablet by mouth three times weekly at bedtime. Patient not  taking: Reported on 11/02/2020 06/21/20   Mayer Masker, PA-C    Review of Systems  Constitutional: Negative.   HENT: Negative.   Eyes: Negative.   Respiratory: Negative.   Cardiovascular: Negative.   Gastrointestinal: Negative.   Genitourinary: Negative.  Negative for dysuria, frequency, hematuria and urgency.       See HPI  Musculoskeletal: Negative.   Skin: Negative.   Neurological: Negative.   Psychiatric/Behavioral: Negative.      Physical Exam BP 128/88    Ht 5\' 7"  (1.702 m)    Wt 223 lb (101.2 kg)    BMI 34.93 kg/m  No LMP recorded. (Menstrual status: Irregular Periods). Physical Exam Constitutional:      General: She is not in acute distress.    Appearance: Normal appearance.  Genitourinary:     No lesions in the vagina.     Right Labia: rash, tenderness and skin changes.     Right Labia: No lesions.    Left Labia: tenderness, skin changes and rash.     Left Labia: No lesions.    Vaginal erythema present.     No vaginal tenderness or bleeding.      Right Adnexa: not tender, not full and no mass present.    Left Adnexa: not tender, not full and no mass present.    No cervical motion tenderness, friability, polyp or nabothian cyst.     Uterus is not enlarged, fixed or tender.  HENT:     Head: Normocephalic and atraumatic.  Eyes:     General: No scleral icterus.    Conjunctiva/sclera: Conjunctivae normal.  Neurological:     General: No focal deficit present.     Mental Status: She is alert and oriented to person, place, and time.     Cranial Nerves: No cranial nerve deficit.  Psychiatric:        Mood and Affect: Mood normal.        Behavior: Behavior normal.        Judgment: Judgment normal.   Wet Prep: PH: 4 Clue Cells: Negative Fungal elements: Negative Trichomonas: Positive   Female chaperone present for pelvic and breast  portions of the physical exam  Assessment: 39 y.o. G3P3 female here for  1. Trichomonas vaginitis   2. Screen for STD  (sexually transmitted disease)   3. Secondary oligomenorrhea      Plan: Problem List Items Addressed This Visit   None   Visit Diagnoses    Trichomonas vaginitis    -  Primary   Relevant Medications   metroNIDAZOLE (FLAGYL) 500 MG tablet   Screen for STD (sexually transmitted disease)       Relevant Orders   Cervicovaginal  ancillary only   Secondary oligomenorrhea       Relevant Medications   medroxyPROGESTERone (PROVERA) 10 MG tablet     Trichomonas vaginitis: will treat with standard CDC-recommended treatment. Recommend partner treatment and abstinence until both have been treated for 7 days to reduce risk of re-infection.  Other STD screening sent.   Missed menses: I recommend she take a pregnancy test. If negative, she can use Provera x 10 days to affect menses. We discussed that she should not have this happen often. If it does, we should look into treatment for this.   Thomasene Mohair, MD 11/02/2020 4:51 PM

## 2020-11-06 LAB — CERVICOVAGINAL ANCILLARY ONLY
Chlamydia: NEGATIVE
Comment: NEGATIVE
Comment: NORMAL
Neisseria Gonorrhea: NEGATIVE

## 2020-11-08 ENCOUNTER — Telehealth: Payer: Self-pay | Admitting: Physician Assistant

## 2020-11-08 NOTE — Telephone Encounter (Signed)
Patient needs a refill on Saxenda and uses [facility]. Thanks

## 2020-11-08 NOTE — Telephone Encounter (Signed)
Patient advised she would need an appointment for further med refills of the saxenda-per [facility]. Patient last seen 8/21 and advised then to follow up in 4 wks. No follow up apt was made.   Patient is upset that we will not refill medication. Patient states she is going to transfer care. AS, CMA

## 2020-11-26 ENCOUNTER — Other Ambulatory Visit: Payer: Self-pay | Admitting: Physician Assistant

## 2020-11-26 DIAGNOSIS — R5383 Other fatigue: Secondary | ICD-10-CM | POA: Diagnosis not present

## 2020-11-26 DIAGNOSIS — Z1152 Encounter for screening for COVID-19: Secondary | ICD-10-CM | POA: Diagnosis not present

## 2020-11-26 DIAGNOSIS — Z131 Encounter for screening for diabetes mellitus: Secondary | ICD-10-CM | POA: Diagnosis not present

## 2020-11-26 DIAGNOSIS — N914 Secondary oligomenorrhea: Secondary | ICD-10-CM | POA: Diagnosis not present

## 2020-11-26 DIAGNOSIS — Z0000 Encounter for general adult medical examination without abnormal findings: Secondary | ICD-10-CM | POA: Diagnosis not present

## 2020-11-26 DIAGNOSIS — Z1159 Encounter for screening for other viral diseases: Secondary | ICD-10-CM | POA: Diagnosis not present

## 2020-11-26 DIAGNOSIS — I10 Essential (primary) hypertension: Secondary | ICD-10-CM | POA: Diagnosis not present

## 2020-11-26 DIAGNOSIS — R0602 Shortness of breath: Secondary | ICD-10-CM | POA: Diagnosis not present

## 2020-11-26 DIAGNOSIS — E559 Vitamin D deficiency, unspecified: Secondary | ICD-10-CM | POA: Diagnosis not present

## 2020-11-26 DIAGNOSIS — F17210 Nicotine dependence, cigarettes, uncomplicated: Secondary | ICD-10-CM | POA: Diagnosis not present

## 2020-11-26 DIAGNOSIS — R350 Frequency of micturition: Secondary | ICD-10-CM | POA: Diagnosis not present

## 2020-11-28 ENCOUNTER — Other Ambulatory Visit: Payer: Self-pay | Admitting: Physician Assistant

## 2020-12-01 ENCOUNTER — Other Ambulatory Visit: Payer: No Typology Code available for payment source

## 2020-12-26 ENCOUNTER — Other Ambulatory Visit: Payer: Self-pay | Admitting: Nurse Practitioner

## 2020-12-26 DIAGNOSIS — I10 Essential (primary) hypertension: Secondary | ICD-10-CM | POA: Diagnosis not present

## 2020-12-26 DIAGNOSIS — N951 Menopausal and female climacteric states: Secondary | ICD-10-CM | POA: Diagnosis not present

## 2020-12-26 DIAGNOSIS — Z20822 Contact with and (suspected) exposure to covid-19: Secondary | ICD-10-CM | POA: Diagnosis not present

## 2020-12-26 DIAGNOSIS — D539 Nutritional anemia, unspecified: Secondary | ICD-10-CM | POA: Diagnosis not present

## 2020-12-26 DIAGNOSIS — F17210 Nicotine dependence, cigarettes, uncomplicated: Secondary | ICD-10-CM | POA: Diagnosis not present

## 2020-12-26 DIAGNOSIS — R635 Abnormal weight gain: Secondary | ICD-10-CM | POA: Diagnosis not present

## 2020-12-26 DIAGNOSIS — Z1159 Encounter for screening for other viral diseases: Secondary | ICD-10-CM | POA: Diagnosis not present

## 2020-12-26 DIAGNOSIS — E538 Deficiency of other specified B group vitamins: Secondary | ICD-10-CM | POA: Diagnosis not present

## 2020-12-26 DIAGNOSIS — Z79899 Other long term (current) drug therapy: Secondary | ICD-10-CM | POA: Diagnosis not present

## 2020-12-26 DIAGNOSIS — E669 Obesity, unspecified: Secondary | ICD-10-CM | POA: Diagnosis not present

## 2020-12-26 DIAGNOSIS — E559 Vitamin D deficiency, unspecified: Secondary | ICD-10-CM | POA: Diagnosis not present

## 2020-12-26 DIAGNOSIS — E8881 Metabolic syndrome: Secondary | ICD-10-CM | POA: Diagnosis not present

## 2021-01-07 ENCOUNTER — Other Ambulatory Visit: Payer: Self-pay | Admitting: Nurse Practitioner

## 2021-01-07 DIAGNOSIS — Z1231 Encounter for screening mammogram for malignant neoplasm of breast: Secondary | ICD-10-CM

## 2021-01-11 ENCOUNTER — Ambulatory Visit
Admission: RE | Admit: 2021-01-11 | Discharge: 2021-01-11 | Disposition: A | Discharge status: Home / Self Care | Payer: 59 | Source: Ambulatory Visit | Attending: Nurse Practitioner | Admitting: Nurse Practitioner

## 2021-01-11 ENCOUNTER — Other Ambulatory Visit: Payer: Self-pay

## 2021-01-11 DIAGNOSIS — Z1231 Encounter for screening mammogram for malignant neoplasm of breast: Secondary | ICD-10-CM | POA: Diagnosis not present

## 2021-01-23 ENCOUNTER — Other Ambulatory Visit: Payer: Self-pay | Admitting: Nurse Practitioner

## 2021-01-23 DIAGNOSIS — E538 Deficiency of other specified B group vitamins: Secondary | ICD-10-CM | POA: Diagnosis not present

## 2021-01-23 DIAGNOSIS — E669 Obesity, unspecified: Secondary | ICD-10-CM | POA: Diagnosis not present

## 2021-01-23 DIAGNOSIS — F17210 Nicotine dependence, cigarettes, uncomplicated: Secondary | ICD-10-CM | POA: Diagnosis not present

## 2021-01-23 DIAGNOSIS — E559 Vitamin D deficiency, unspecified: Secondary | ICD-10-CM | POA: Diagnosis not present

## 2021-01-23 DIAGNOSIS — R635 Abnormal weight gain: Secondary | ICD-10-CM | POA: Diagnosis not present

## 2021-01-23 DIAGNOSIS — I10 Essential (primary) hypertension: Secondary | ICD-10-CM | POA: Diagnosis not present

## 2021-01-31 DIAGNOSIS — Z1152 Encounter for screening for COVID-19: Secondary | ICD-10-CM | POA: Diagnosis not present

## 2021-02-18 ENCOUNTER — Other Ambulatory Visit: Payer: Self-pay

## 2021-02-18 MED FILL — Semaglutide (Weight Mngmt) Soln Auto-Injector 0.25 MG/0.5ML: SUBCUTANEOUS | 28 days supply | Qty: 2 | Fill #0 | Status: CN

## 2021-02-26 ENCOUNTER — Other Ambulatory Visit: Payer: Self-pay

## 2021-03-12 ENCOUNTER — Other Ambulatory Visit: Payer: Self-pay

## 2021-03-28 ENCOUNTER — Other Ambulatory Visit: Payer: Self-pay

## 2021-03-28 DIAGNOSIS — E538 Deficiency of other specified B group vitamins: Secondary | ICD-10-CM | POA: Diagnosis not present

## 2021-03-28 DIAGNOSIS — J029 Acute pharyngitis, unspecified: Secondary | ICD-10-CM | POA: Diagnosis not present

## 2021-03-28 DIAGNOSIS — E669 Obesity, unspecified: Secondary | ICD-10-CM | POA: Diagnosis not present

## 2021-03-28 DIAGNOSIS — F17210 Nicotine dependence, cigarettes, uncomplicated: Secondary | ICD-10-CM | POA: Diagnosis not present

## 2021-03-28 DIAGNOSIS — R059 Cough, unspecified: Secondary | ICD-10-CM | POA: Diagnosis not present

## 2021-03-28 DIAGNOSIS — I10 Essential (primary) hypertension: Secondary | ICD-10-CM | POA: Diagnosis not present

## 2021-03-28 DIAGNOSIS — E559 Vitamin D deficiency, unspecified: Secondary | ICD-10-CM | POA: Diagnosis not present

## 2021-03-28 DIAGNOSIS — R635 Abnormal weight gain: Secondary | ICD-10-CM | POA: Diagnosis not present

## 2021-03-28 MED ORDER — ROSUVASTATIN CALCIUM 10 MG PO TABS
ORAL_TABLET | ORAL | 1 refills | Status: AC
  Filled 2021-03-28: qty 90, 90d supply, fill #0
  Filled 2021-07-26: qty 90, 90d supply, fill #1

## 2021-03-28 MED ORDER — AMOXICILLIN 500 MG PO CAPS
ORAL_CAPSULE | ORAL | 0 refills | Status: DC
  Filled 2021-03-28: qty 28, 7d supply, fill #0

## 2021-03-28 MED ORDER — FAMOTIDINE 40 MG PO TABS
ORAL_TABLET | ORAL | 1 refills | Status: AC
  Filled 2021-03-28: qty 90, 90d supply, fill #0

## 2021-03-28 MED ORDER — PREDNISONE 20 MG PO TABS
ORAL_TABLET | ORAL | 0 refills | Status: DC
  Filled 2021-03-28: qty 10, 5d supply, fill #0

## 2021-03-28 MED ORDER — TOPIRAMATE 50 MG PO TABS
ORAL_TABLET | ORAL | 0 refills | Status: DC
  Filled 2021-03-28: qty 90, 90d supply, fill #0

## 2021-03-28 MED ORDER — LISINOPRIL 10 MG PO TABS
1.0000 | ORAL_TABLET | Freq: Every day | ORAL | 1 refills | Status: AC
  Filled 2021-03-28: qty 90, 90d supply, fill #0
  Filled 2021-07-26: qty 90, 90d supply, fill #1

## 2021-03-28 MED ORDER — RIZATRIPTAN BENZOATE 10 MG PO TABS
10.0000 mg | ORAL_TABLET | Freq: Every day | ORAL | 1 refills | Status: AC
  Filled 2021-03-28: qty 18, 30d supply, fill #0

## 2021-03-28 MED ORDER — OZEMPIC (1 MG/DOSE) 4 MG/3ML ~~LOC~~ SOPN
PEN_INJECTOR | SUBCUTANEOUS | 1 refills | Status: DC
  Filled 2021-03-28: qty 3, 28d supply, fill #0
  Filled 2021-05-01: qty 3, 28d supply, fill #1

## 2021-04-08 DIAGNOSIS — M2241 Chondromalacia patellae, right knee: Secondary | ICD-10-CM | POA: Diagnosis not present

## 2021-04-08 DIAGNOSIS — S83191A Other subluxation of right knee, initial encounter: Secondary | ICD-10-CM | POA: Diagnosis not present

## 2021-04-08 DIAGNOSIS — M1711 Unilateral primary osteoarthritis, right knee: Secondary | ICD-10-CM | POA: Diagnosis not present

## 2021-05-01 ENCOUNTER — Other Ambulatory Visit: Payer: Self-pay

## 2021-05-01 IMAGING — MG MM DIGITAL SCREENING BILAT W/ TOMO AND CAD
8 series · 8 of 24 positions shown · non-contrast
Comparison: Previous exam(s).

ACR Breast Density Category a: The breast tissue is almost entirely
fatty.

CLINICAL DATA: Screening.

EXAM:
DIGITAL SCREENING BILATERAL MAMMOGRAM WITH TOMOSYNTHESIS AND CAD
TECHNIQUE: Bilateral screening digital craniocaudal and mediolateral oblique
mammograms were obtained. Bilateral screening digital breast
tomosynthesis was performed. The images were evaluated with
computer-aided detection.

[L CC synth-2D]
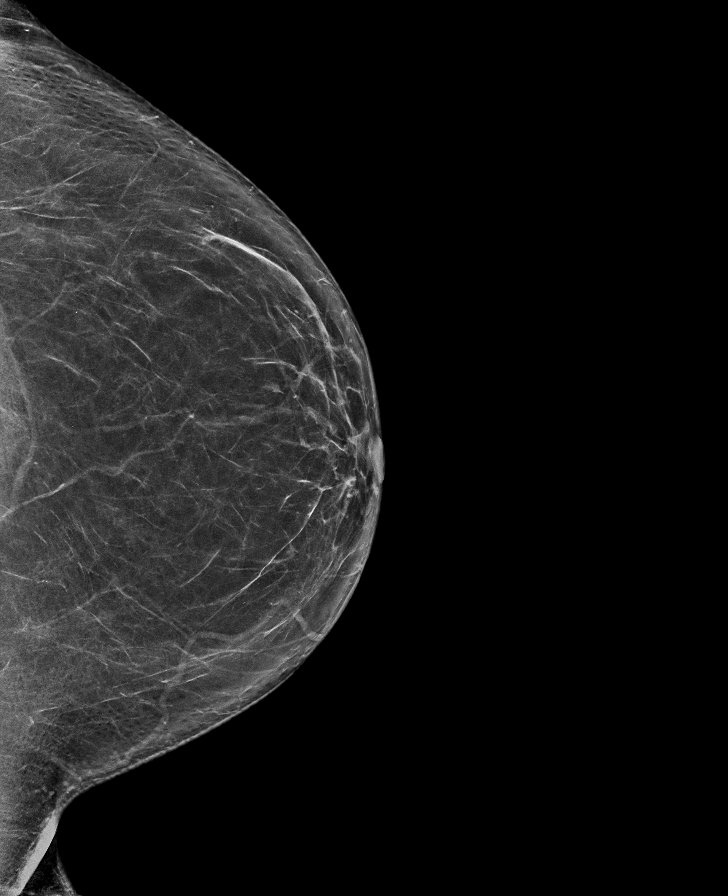

[L MLO synth-2D]
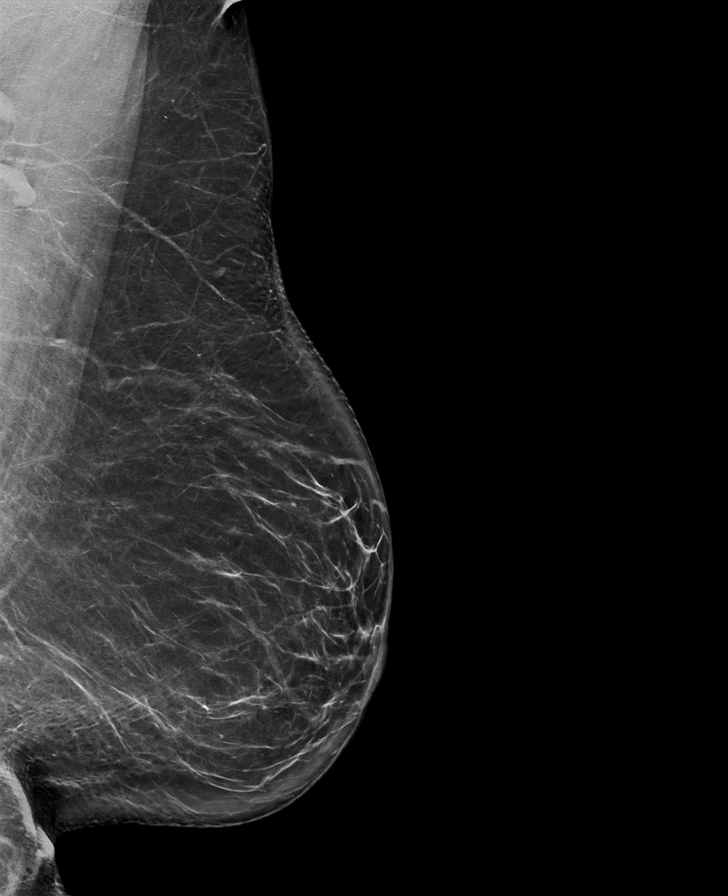

[R MLO synth-2D]
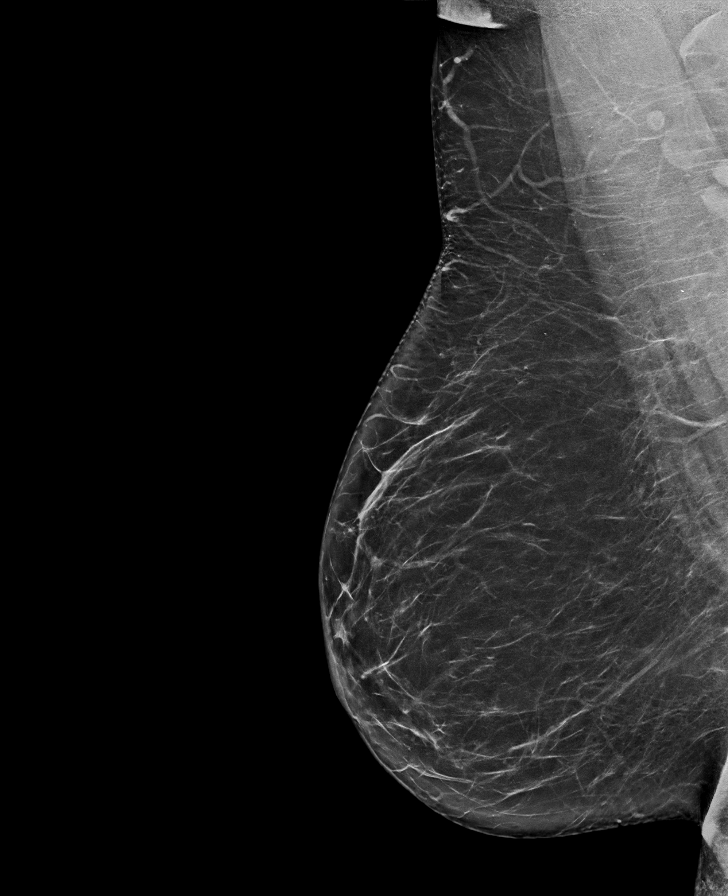

[R CC synth-2D]
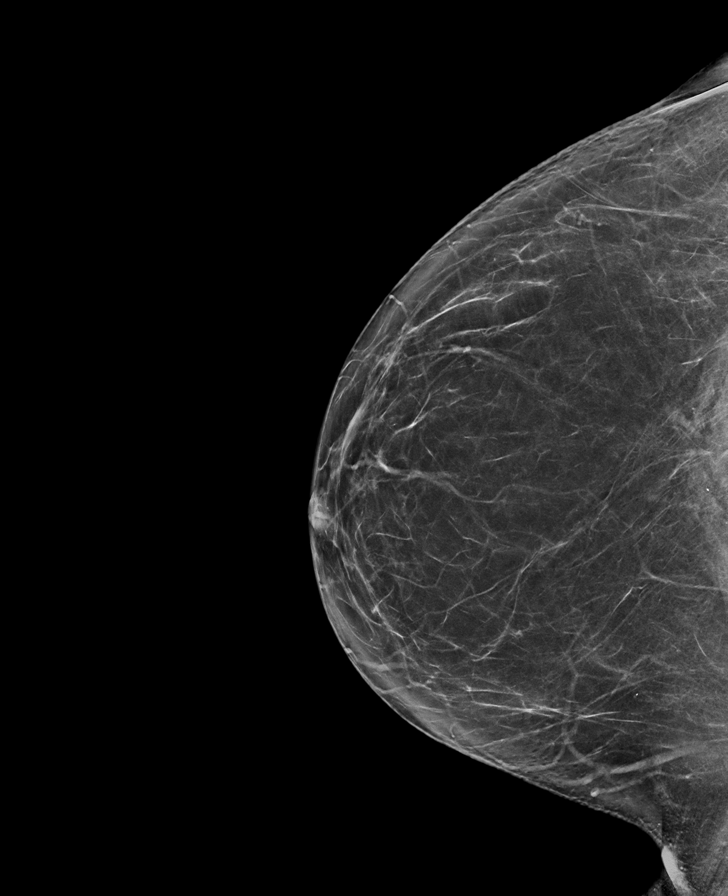

[R MLO tomo · tomo slice 42/83.0]
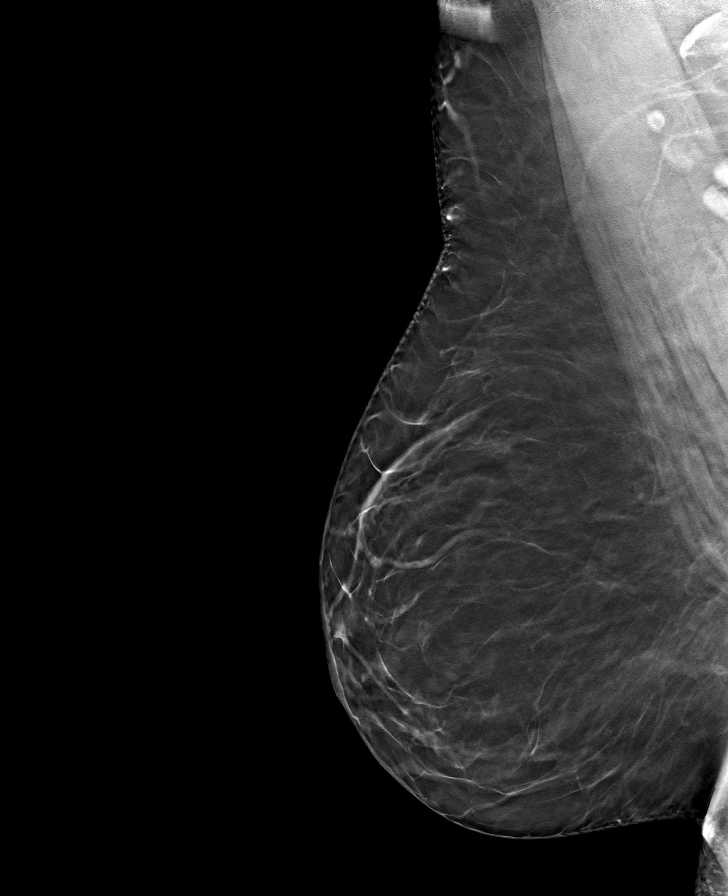

[R CC tomo · tomo slice 39/77.0]
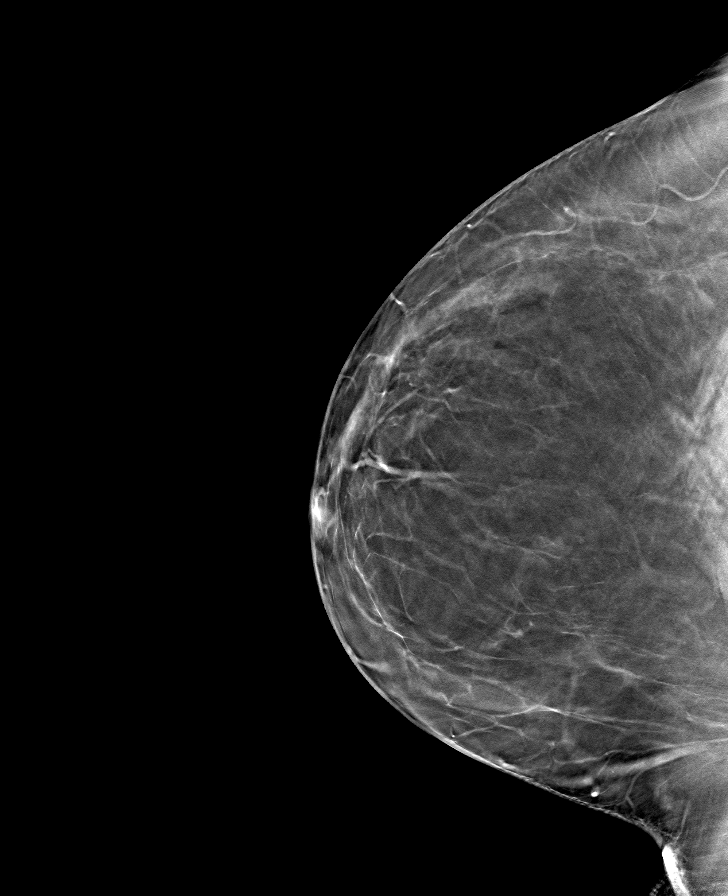

[L MLO tomo · tomo slice 43/86.0]
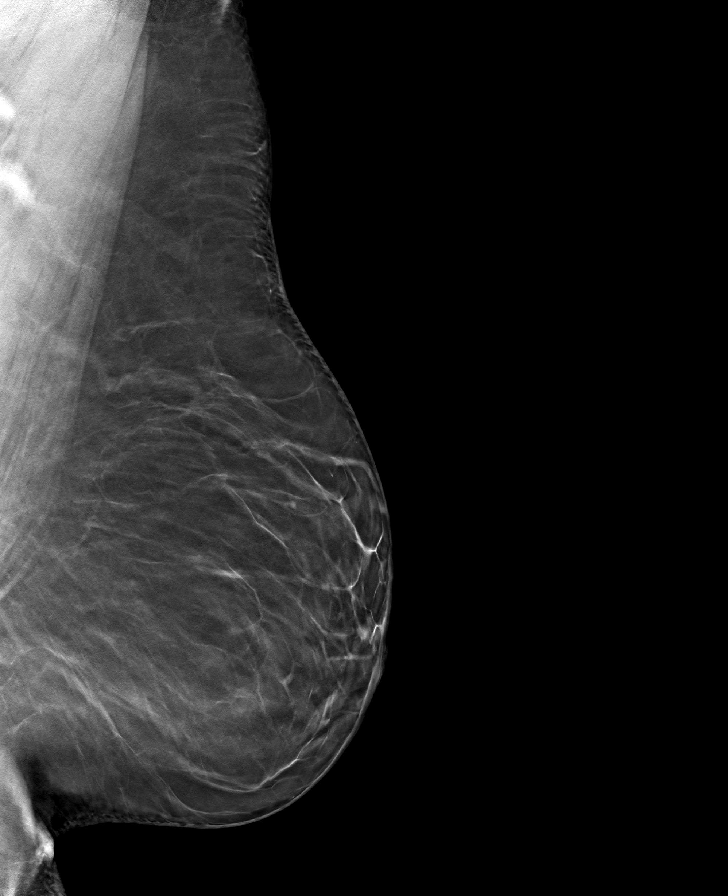

[L CC tomo · tomo slice 39/77.0]
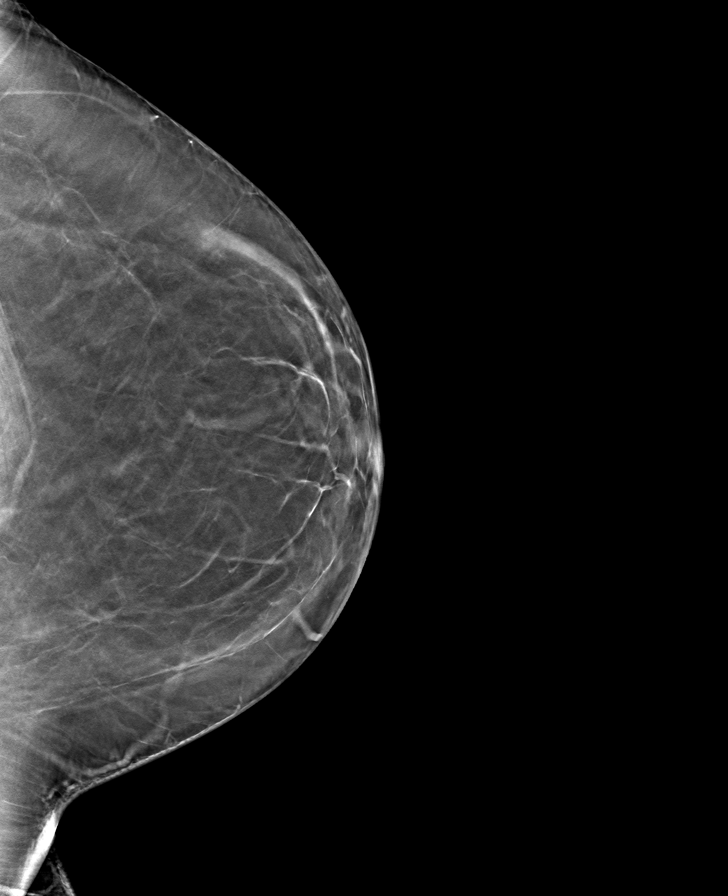

[8 of 24 positions shown; findings below may reference images not displayed]

FINDINGS: There are no findings suspicious for malignancy. The images were
evaluated with computer-aided detection.
IMPRESSION: No mammographic evidence of malignancy. A result letter of this
screening mammogram will be mailed directly to the patient.

RECOMMENDATION:
Screening mammogram in one year. (Code:JP-J-DD5)

BI-RADS CATEGORY  1: Negative.

## 2021-05-01 MED ORDER — TOPIRAMATE 50 MG PO TABS
50.0000 mg | ORAL_TABLET | Freq: Every day | ORAL | 0 refills | Status: AC
  Filled 2021-05-01: qty 90, 90d supply, fill #0

## 2021-05-31 ENCOUNTER — Other Ambulatory Visit: Payer: Self-pay

## 2021-05-31 MED ORDER — OZEMPIC (1 MG/DOSE) 4 MG/3ML ~~LOC~~ SOPN
PEN_INJECTOR | SUBCUTANEOUS | 1 refills | Status: AC
  Filled 2021-05-31: qty 9, 84d supply, fill #0
  Filled 2021-07-26: qty 9, 84d supply, fill #1

## 2021-06-04 ENCOUNTER — Other Ambulatory Visit: Payer: Self-pay

## 2021-07-02 DIAGNOSIS — H524 Presbyopia: Secondary | ICD-10-CM | POA: Diagnosis not present

## 2021-07-08 ENCOUNTER — Other Ambulatory Visit: Payer: Self-pay

## 2021-07-08 DIAGNOSIS — R5383 Other fatigue: Secondary | ICD-10-CM | POA: Diagnosis not present

## 2021-07-08 DIAGNOSIS — Z1159 Encounter for screening for other viral diseases: Secondary | ICD-10-CM | POA: Diagnosis not present

## 2021-07-08 DIAGNOSIS — R7303 Prediabetes: Secondary | ICD-10-CM | POA: Diagnosis not present

## 2021-07-08 DIAGNOSIS — E559 Vitamin D deficiency, unspecified: Secondary | ICD-10-CM | POA: Diagnosis not present

## 2021-07-08 DIAGNOSIS — D539 Nutritional anemia, unspecified: Secondary | ICD-10-CM | POA: Diagnosis not present

## 2021-07-08 DIAGNOSIS — E669 Obesity, unspecified: Secondary | ICD-10-CM | POA: Diagnosis not present

## 2021-07-08 DIAGNOSIS — I10 Essential (primary) hypertension: Secondary | ICD-10-CM | POA: Diagnosis not present

## 2021-07-08 DIAGNOSIS — Z79899 Other long term (current) drug therapy: Secondary | ICD-10-CM | POA: Diagnosis not present

## 2021-07-08 DIAGNOSIS — E782 Mixed hyperlipidemia: Secondary | ICD-10-CM | POA: Diagnosis not present

## 2021-07-08 DIAGNOSIS — E538 Deficiency of other specified B group vitamins: Secondary | ICD-10-CM | POA: Diagnosis not present

## 2021-07-08 DIAGNOSIS — R635 Abnormal weight gain: Secondary | ICD-10-CM | POA: Diagnosis not present

## 2021-07-08 DIAGNOSIS — F17210 Nicotine dependence, cigarettes, uncomplicated: Secondary | ICD-10-CM | POA: Diagnosis not present

## 2021-07-08 DIAGNOSIS — K219 Gastro-esophageal reflux disease without esophagitis: Secondary | ICD-10-CM | POA: Diagnosis not present

## 2021-07-08 MED ORDER — OZEMPIC (1 MG/DOSE) 4 MG/3ML ~~LOC~~ SOPN
PEN_INJECTOR | SUBCUTANEOUS | 1 refills | Status: AC
  Filled 2021-07-08: qty 12, 84d supply, fill #0
  Filled 2021-08-19: qty 9, 84d supply, fill #0

## 2021-07-08 MED ORDER — ROSUVASTATIN CALCIUM 10 MG PO TABS
ORAL_TABLET | ORAL | 1 refills | Status: AC
  Filled 2021-07-08: qty 90, 90d supply, fill #0

## 2021-07-08 MED ORDER — TOPIRAMATE 50 MG PO TABS
50.0000 mg | ORAL_TABLET | Freq: Every day | ORAL | 0 refills | Status: AC
  Filled 2021-07-08 – 2021-08-19 (×2): qty 90, 90d supply, fill #0

## 2021-07-08 MED ORDER — RIZATRIPTAN BENZOATE 10 MG PO TABS
10.0000 mg | ORAL_TABLET | Freq: Every day | ORAL | 1 refills | Status: AC
  Filled 2021-07-08: qty 18, 30d supply, fill #0

## 2021-07-08 MED ORDER — LISINOPRIL 10 MG PO TABS
10.0000 mg | ORAL_TABLET | Freq: Every day | ORAL | 1 refills | Status: AC
  Filled 2021-07-08: qty 90, 90d supply, fill #0

## 2021-07-08 MED ORDER — VITAMIN D (ERGOCALCIFEROL) 1.25 MG (50000 UNIT) PO CAPS
ORAL_CAPSULE | ORAL | 1 refills | Status: AC
  Filled 2021-07-08 – 2021-07-26 (×2): qty 12, 84d supply, fill #0

## 2021-07-08 MED ORDER — OMEPRAZOLE 40 MG PO CPDR
DELAYED_RELEASE_CAPSULE | ORAL | 3 refills | Status: AC
  Filled 2021-07-08 – 2021-07-26 (×2): qty 30, 30d supply, fill #0

## 2021-07-23 ENCOUNTER — Other Ambulatory Visit: Payer: Self-pay

## 2021-07-25 ENCOUNTER — Other Ambulatory Visit: Payer: Self-pay

## 2021-07-26 ENCOUNTER — Other Ambulatory Visit: Payer: Self-pay

## 2021-08-19 ENCOUNTER — Other Ambulatory Visit: Payer: Self-pay

## 2021-11-14 ENCOUNTER — Other Ambulatory Visit: Payer: Self-pay

## 2021-11-14 DIAGNOSIS — E669 Obesity, unspecified: Secondary | ICD-10-CM | POA: Diagnosis not present

## 2021-11-14 DIAGNOSIS — E538 Deficiency of other specified B group vitamins: Secondary | ICD-10-CM | POA: Diagnosis not present

## 2021-11-14 DIAGNOSIS — R7303 Prediabetes: Secondary | ICD-10-CM | POA: Diagnosis not present

## 2021-11-14 DIAGNOSIS — R635 Abnormal weight gain: Secondary | ICD-10-CM | POA: Diagnosis not present

## 2021-11-14 DIAGNOSIS — I10 Essential (primary) hypertension: Secondary | ICD-10-CM | POA: Diagnosis not present

## 2021-11-14 DIAGNOSIS — E559 Vitamin D deficiency, unspecified: Secondary | ICD-10-CM | POA: Diagnosis not present

## 2021-11-14 DIAGNOSIS — F17210 Nicotine dependence, cigarettes, uncomplicated: Secondary | ICD-10-CM | POA: Diagnosis not present

## 2021-11-14 MED ORDER — ROSUVASTATIN CALCIUM 10 MG PO TABS
ORAL_TABLET | ORAL | 1 refills | Status: AC
  Filled 2021-11-14: qty 90, 90d supply, fill #0

## 2021-11-14 MED ORDER — LISINOPRIL 10 MG PO TABS
10.0000 mg | ORAL_TABLET | Freq: Every day | ORAL | 1 refills | Status: AC
  Filled 2021-11-14: qty 90, 90d supply, fill #0

## 2021-11-14 MED ORDER — OZEMPIC (1 MG/DOSE) 4 MG/3ML ~~LOC~~ SOPN
PEN_INJECTOR | SUBCUTANEOUS | 1 refills | Status: AC
  Filled 2021-11-14: qty 9, 84d supply, fill #0

## 2021-11-14 MED ORDER — OMEPRAZOLE 40 MG PO CPDR
DELAYED_RELEASE_CAPSULE | ORAL | 3 refills | Status: AC
  Filled 2021-11-14: qty 30, 30d supply, fill #0

## 2021-11-14 MED ORDER — VITAMIN D (ERGOCALCIFEROL) 1.25 MG (50000 UNIT) PO CAPS
ORAL_CAPSULE | ORAL | 1 refills | Status: AC
  Filled 2021-11-14: qty 12, 84d supply, fill #0

## 2021-11-14 MED ORDER — FAMOTIDINE 40 MG PO TABS
ORAL_TABLET | ORAL | 1 refills | Status: AC
  Filled 2021-11-14: qty 90, 90d supply, fill #0

## 2021-11-14 MED ORDER — RIZATRIPTAN BENZOATE 10 MG PO TABS
10.0000 mg | ORAL_TABLET | Freq: Every day | ORAL | 1 refills | Status: AC
  Filled 2021-11-14: qty 18, 30d supply, fill #0

## 2021-11-14 MED ORDER — TOPIRAMATE 50 MG PO TABS
50.0000 mg | ORAL_TABLET | Freq: Every day | ORAL | 0 refills | Status: AC
  Filled 2021-11-14: qty 90, 90d supply, fill #0

## 2022-02-05 ENCOUNTER — Other Ambulatory Visit: Payer: Self-pay

## 2022-02-05 DIAGNOSIS — E538 Deficiency of other specified B group vitamins: Secondary | ICD-10-CM | POA: Diagnosis not present

## 2022-02-05 DIAGNOSIS — Z131 Encounter for screening for diabetes mellitus: Secondary | ICD-10-CM | POA: Diagnosis not present

## 2022-02-05 DIAGNOSIS — R350 Frequency of micturition: Secondary | ICD-10-CM | POA: Diagnosis not present

## 2022-02-05 DIAGNOSIS — Z1159 Encounter for screening for other viral diseases: Secondary | ICD-10-CM | POA: Diagnosis not present

## 2022-02-05 DIAGNOSIS — R5383 Other fatigue: Secondary | ICD-10-CM | POA: Diagnosis not present

## 2022-02-05 DIAGNOSIS — R0602 Shortness of breath: Secondary | ICD-10-CM | POA: Diagnosis not present

## 2022-02-05 DIAGNOSIS — Z79899 Other long term (current) drug therapy: Secondary | ICD-10-CM | POA: Diagnosis not present

## 2022-02-05 DIAGNOSIS — Z114 Encounter for screening for human immunodeficiency virus [HIV]: Secondary | ICD-10-CM | POA: Diagnosis not present

## 2022-02-05 DIAGNOSIS — E559 Vitamin D deficiency, unspecified: Secondary | ICD-10-CM | POA: Diagnosis not present

## 2022-02-05 DIAGNOSIS — Z0000 Encounter for general adult medical examination without abnormal findings: Secondary | ICD-10-CM | POA: Diagnosis not present

## 2022-02-05 DIAGNOSIS — D539 Nutritional anemia, unspecified: Secondary | ICD-10-CM | POA: Diagnosis not present

## 2022-02-05 DIAGNOSIS — F17210 Nicotine dependence, cigarettes, uncomplicated: Secondary | ICD-10-CM | POA: Diagnosis not present

## 2022-02-05 MED ORDER — TOPIRAMATE 50 MG PO TABS
50.0000 mg | ORAL_TABLET | Freq: Every day | ORAL | 0 refills | Status: AC
  Filled 2022-02-05: qty 90, 90d supply, fill #0

## 2022-02-05 MED ORDER — FAMOTIDINE 40 MG PO TABS
ORAL_TABLET | ORAL | 1 refills | Status: AC
  Filled 2022-02-05: qty 90, 90d supply, fill #0

## 2022-02-05 MED ORDER — VITAMIN D (ERGOCALCIFEROL) 1.25 MG (50000 UNIT) PO CAPS
ORAL_CAPSULE | ORAL | 1 refills | Status: AC
  Filled 2022-02-05: qty 12, 84d supply, fill #0

## 2022-02-05 MED ORDER — RIZATRIPTAN BENZOATE 10 MG PO TABS
ORAL_TABLET | ORAL | 1 refills | Status: AC
  Filled 2022-02-05: qty 18, 30d supply, fill #0

## 2022-02-05 MED ORDER — OMEPRAZOLE 40 MG PO CPDR
DELAYED_RELEASE_CAPSULE | ORAL | 3 refills | Status: AC
  Filled 2022-02-05: qty 30, 30d supply, fill #0

## 2022-02-05 MED ORDER — PHENTERMINE HCL 15 MG PO CAPS
ORAL_CAPSULE | ORAL | 0 refills | Status: AC
  Filled 2022-02-05: qty 30, 30d supply, fill #0

## 2022-02-05 MED ORDER — WEGOVY 0.25 MG/0.5ML ~~LOC~~ SOAJ
SUBCUTANEOUS | 0 refills | Status: DC
  Filled 2022-02-05: qty 2, 28d supply, fill #0

## 2022-02-05 MED ORDER — LISINOPRIL 10 MG PO TABS
10.0000 mg | ORAL_TABLET | Freq: Every day | ORAL | 1 refills | Status: AC
  Filled 2022-02-05: qty 90, 90d supply, fill #0

## 2022-02-05 MED ORDER — ROSUVASTATIN CALCIUM 10 MG PO TABS
ORAL_TABLET | ORAL | 1 refills | Status: AC
  Filled 2022-02-05: qty 90, 90d supply, fill #0

## 2022-03-17 ENCOUNTER — Other Ambulatory Visit: Payer: Self-pay

## 2022-03-17 MED ORDER — WEGOVY 0.25 MG/0.5ML ~~LOC~~ SOAJ
SUBCUTANEOUS | 0 refills | Status: AC
Start: 2022-03-17 — End: ?
  Filled 2022-03-17 – 2022-03-19 (×2): qty 2, 28d supply, fill #0

## 2022-03-19 ENCOUNTER — Other Ambulatory Visit: Payer: Self-pay

## 2022-03-28 ENCOUNTER — Ambulatory Visit: Payer: 59 | Admitting: Podiatry

## 2022-03-28 ENCOUNTER — Ambulatory Visit (INDEPENDENT_AMBULATORY_CARE_PROVIDER_SITE_OTHER): Payer: 59

## 2022-03-28 DIAGNOSIS — M79671 Pain in right foot: Secondary | ICD-10-CM

## 2022-03-28 NOTE — Progress Notes (Signed)
? ?  HPI: 41 y.o. female presenting today as a new patient for evaluation of right forefoot pain has been going on for about 2 weeks.  Patient states that over the past 2 weeks the pain has resolved significantly.  She no longer has any pain or tenderness but that she would continue to follow-up here in the office.  Patient states that initially it felt as if she was walking on a rock in her shoe.  She felt sharp stabbing pain to the plantar aspect of the right forefoot.  She does admit to going barefoot throughout the house and at home a significant portion of the day.  She denies a history of injury however. ? ?Past Medical History:  ?Diagnosis Date  ? ADHD   ? COVID-19   ? Hypertension   ? Lumbar herniated disc   ? Migraine   ? Pain   ? Sciatica   ? ? ?Past Surgical History:  ?Procedure Laterality Date  ? CESAREAN SECTION    ? HYSTEROSCOPY    ? KNEE ARTHROSCOPY    ? TUBAL LIGATION    ? ? ?No Known Allergies ?  ?Physical Exam: ?General: The patient is alert and oriented x3 in no acute distress. ? ?Dermatology: Skin is warm, dry and supple bilateral lower extremities. Negative for open lesions or macerations. ? ?Vascular: Palpable pedal pulses bilaterally. Capillary refill within normal limits.  Negative for any significant edema or erythema ? ?Neurological: Light touch and protective threshold grossly intact ? ?Musculoskeletal Exam: No pedal deformities noted.  There is no pain to palpation or range of motion of the right forefoot ? ?Radiographic Exam:  ?Normal osseous mineralization. Joint spaces preserved. No fracture/dislocation/boney destruction.   ? ?Assessment: ?1.  Right forefoot pain; resolved ? ? ?Plan of Care:  ?1. Patient evaluated. X-Rays reviewed.  ?2.  Advised against going barefoot.  Recommend good supportive shoes and sneakers at all times ?3.  Return to clinic as needed ? 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 ?  ?  ?Felecia Shelling, DPM ?[facility] ? ?Dr. Felecia Shelling, DPM  ?  ?[address]                                         ?[place], [place] [address]                ?Office [phone]  ?Fax [phone] ? ? ? ? ?

## 2022-04-10 ENCOUNTER — Other Ambulatory Visit: Payer: Self-pay

## 2022-04-10 DIAGNOSIS — I10 Essential (primary) hypertension: Secondary | ICD-10-CM | POA: Diagnosis not present

## 2022-04-10 DIAGNOSIS — E782 Mixed hyperlipidemia: Secondary | ICD-10-CM | POA: Diagnosis not present

## 2022-04-10 DIAGNOSIS — E538 Deficiency of other specified B group vitamins: Secondary | ICD-10-CM | POA: Diagnosis not present

## 2022-04-10 DIAGNOSIS — E669 Obesity, unspecified: Secondary | ICD-10-CM | POA: Diagnosis not present

## 2022-04-10 DIAGNOSIS — F17210 Nicotine dependence, cigarettes, uncomplicated: Secondary | ICD-10-CM | POA: Diagnosis not present

## 2022-04-10 DIAGNOSIS — R635 Abnormal weight gain: Secondary | ICD-10-CM | POA: Diagnosis not present

## 2022-04-10 MED ORDER — PHENTERMINE HCL 30 MG PO CAPS
ORAL_CAPSULE | ORAL | 0 refills | Status: DC
  Filled 2022-04-10: qty 30, 30d supply, fill #0

## 2022-04-10 MED ORDER — WEGOVY 0.5 MG/0.5ML ~~LOC~~ SOAJ
SUBCUTANEOUS | 0 refills | Status: AC
  Filled 2022-04-10 – 2022-05-22 (×2): qty 2, 28d supply, fill #0

## 2022-04-10 MED ORDER — ROSUVASTATIN CALCIUM 20 MG PO TABS
ORAL_TABLET | ORAL | 1 refills | Status: AC
  Filled 2022-04-10: qty 90, 90d supply, fill #0

## 2022-04-16 ENCOUNTER — Other Ambulatory Visit: Payer: Self-pay

## 2022-04-23 ENCOUNTER — Other Ambulatory Visit: Payer: Self-pay

## 2022-05-21 ENCOUNTER — Other Ambulatory Visit: Payer: Self-pay

## 2022-05-22 ENCOUNTER — Other Ambulatory Visit: Payer: Self-pay

## 2022-05-23 ENCOUNTER — Other Ambulatory Visit: Payer: Self-pay

## 2022-05-23 DIAGNOSIS — R635 Abnormal weight gain: Secondary | ICD-10-CM | POA: Diagnosis not present

## 2022-05-23 DIAGNOSIS — I10 Essential (primary) hypertension: Secondary | ICD-10-CM | POA: Diagnosis not present

## 2022-05-23 DIAGNOSIS — E538 Deficiency of other specified B group vitamins: Secondary | ICD-10-CM | POA: Diagnosis not present

## 2022-05-23 DIAGNOSIS — K219 Gastro-esophageal reflux disease without esophagitis: Secondary | ICD-10-CM | POA: Diagnosis not present

## 2022-05-23 DIAGNOSIS — R7303 Prediabetes: Secondary | ICD-10-CM | POA: Diagnosis not present

## 2022-05-23 DIAGNOSIS — F17210 Nicotine dependence, cigarettes, uncomplicated: Secondary | ICD-10-CM | POA: Diagnosis not present

## 2022-05-23 DIAGNOSIS — E669 Obesity, unspecified: Secondary | ICD-10-CM | POA: Diagnosis not present

## 2022-05-23 DIAGNOSIS — E559 Vitamin D deficiency, unspecified: Secondary | ICD-10-CM | POA: Diagnosis not present

## 2022-05-23 DIAGNOSIS — E782 Mixed hyperlipidemia: Secondary | ICD-10-CM | POA: Diagnosis not present

## 2022-05-23 MED ORDER — WEGOVY 1 MG/0.5ML ~~LOC~~ SOAJ
SUBCUTANEOUS | 0 refills | Status: AC
Start: 2022-05-23 — End: ?
  Filled 2022-06-19: qty 2, 28d supply, fill #0

## 2022-05-23 MED ORDER — TOPIRAMATE 50 MG PO TABS
50.0000 mg | ORAL_TABLET | Freq: Every day | ORAL | 0 refills | Status: AC
  Filled 2022-05-23: qty 90, 90d supply, fill #0

## 2022-05-23 MED ORDER — PHENTERMINE HCL 30 MG PO CAPS
ORAL_CAPSULE | ORAL | 0 refills | Status: DC
  Filled 2022-05-23: qty 30, 30d supply, fill #0

## 2022-06-19 ENCOUNTER — Other Ambulatory Visit: Payer: Self-pay

## 2022-06-27 ENCOUNTER — Other Ambulatory Visit: Payer: Self-pay

## 2022-06-27 DIAGNOSIS — E538 Deficiency of other specified B group vitamins: Secondary | ICD-10-CM | POA: Diagnosis not present

## 2022-06-27 DIAGNOSIS — E782 Mixed hyperlipidemia: Secondary | ICD-10-CM | POA: Diagnosis not present

## 2022-06-27 DIAGNOSIS — E669 Obesity, unspecified: Secondary | ICD-10-CM | POA: Diagnosis not present

## 2022-06-27 DIAGNOSIS — F17210 Nicotine dependence, cigarettes, uncomplicated: Secondary | ICD-10-CM | POA: Diagnosis not present

## 2022-06-27 DIAGNOSIS — E559 Vitamin D deficiency, unspecified: Secondary | ICD-10-CM | POA: Diagnosis not present

## 2022-06-27 DIAGNOSIS — R7303 Prediabetes: Secondary | ICD-10-CM | POA: Diagnosis not present

## 2022-06-27 DIAGNOSIS — R635 Abnormal weight gain: Secondary | ICD-10-CM | POA: Diagnosis not present

## 2022-06-27 DIAGNOSIS — I10 Essential (primary) hypertension: Secondary | ICD-10-CM | POA: Diagnosis not present

## 2022-06-27 DIAGNOSIS — K219 Gastro-esophageal reflux disease without esophagitis: Secondary | ICD-10-CM | POA: Diagnosis not present

## 2022-06-27 MED ORDER — PHENTERMINE HCL 30 MG PO CAPS
ORAL_CAPSULE | ORAL | 0 refills | Status: DC
  Filled 2022-06-27: qty 30, 30d supply, fill #0

## 2022-06-27 MED ORDER — WEGOVY 1.7 MG/0.75ML ~~LOC~~ SOAJ
SUBCUTANEOUS | 0 refills | Status: AC
  Filled 2022-06-27: qty 3, 28d supply, fill #0

## 2022-07-15 ENCOUNTER — Other Ambulatory Visit: Payer: Self-pay

## 2022-07-22 ENCOUNTER — Other Ambulatory Visit: Payer: Self-pay

## 2022-07-22 MED ORDER — TRIAZOLAM 0.25 MG PO TABS
ORAL_TABLET | ORAL | 0 refills | Status: AC
  Filled 2022-07-22: qty 2, 1d supply, fill #0

## 2022-07-22 MED ORDER — AMOXICILLIN 500 MG PO CAPS
500.0000 mg | ORAL_CAPSULE | Freq: Four times a day (QID) | ORAL | 0 refills | Status: DC
  Filled 2022-07-22: qty 30, 8d supply, fill #0

## 2022-07-25 ENCOUNTER — Other Ambulatory Visit: Payer: Self-pay

## 2022-07-25 MED ORDER — HYDROCODONE-ACETAMINOPHEN 5-325 MG PO TABS
ORAL_TABLET | ORAL | 0 refills | Status: AC
  Filled 2022-07-25: qty 8, 2d supply, fill #0

## 2022-07-25 MED ORDER — CHLORHEXIDINE GLUCONATE 0.12 % MT SOLN
OROMUCOSAL | 1 refills | Status: AC
  Filled 2022-07-25: qty 473, 15d supply, fill #0

## 2022-07-25 MED ORDER — DEXAMETHASONE 4 MG PO TABS
ORAL_TABLET | ORAL | 0 refills | Status: DC
  Filled 2022-07-25: qty 5, 3d supply, fill #0

## 2022-07-29 ENCOUNTER — Other Ambulatory Visit: Payer: Self-pay

## 2022-07-29 DIAGNOSIS — E669 Obesity, unspecified: Secondary | ICD-10-CM | POA: Diagnosis not present

## 2022-07-29 DIAGNOSIS — E782 Mixed hyperlipidemia: Secondary | ICD-10-CM | POA: Diagnosis not present

## 2022-07-29 DIAGNOSIS — K219 Gastro-esophageal reflux disease without esophagitis: Secondary | ICD-10-CM | POA: Diagnosis not present

## 2022-07-29 DIAGNOSIS — E538 Deficiency of other specified B group vitamins: Secondary | ICD-10-CM | POA: Diagnosis not present

## 2022-07-29 DIAGNOSIS — I10 Essential (primary) hypertension: Secondary | ICD-10-CM | POA: Diagnosis not present

## 2022-07-29 DIAGNOSIS — R7303 Prediabetes: Secondary | ICD-10-CM | POA: Diagnosis not present

## 2022-07-29 DIAGNOSIS — Z23 Encounter for immunization: Secondary | ICD-10-CM | POA: Diagnosis not present

## 2022-07-29 DIAGNOSIS — E559 Vitamin D deficiency, unspecified: Secondary | ICD-10-CM | POA: Diagnosis not present

## 2022-07-29 DIAGNOSIS — R635 Abnormal weight gain: Secondary | ICD-10-CM | POA: Diagnosis not present

## 2022-07-29 DIAGNOSIS — F17210 Nicotine dependence, cigarettes, uncomplicated: Secondary | ICD-10-CM | POA: Diagnosis not present

## 2022-07-29 MED ORDER — PHENTERMINE HCL 30 MG PO CAPS
ORAL_CAPSULE | ORAL | 0 refills | Status: DC
  Filled 2022-07-29: qty 30, 30d supply, fill #0

## 2022-07-29 MED ORDER — WEGOVY 2.4 MG/0.75ML ~~LOC~~ SOAJ
SUBCUTANEOUS | 0 refills | Status: AC
  Filled 2022-07-29: qty 3, 28d supply, fill #0

## 2022-07-29 MED ORDER — OMEPRAZOLE 40 MG PO CPDR
DELAYED_RELEASE_CAPSULE | ORAL | 3 refills | Status: AC
  Filled 2022-07-29: qty 30, 30d supply, fill #0

## 2022-09-15 DIAGNOSIS — M25561 Pain in right knee: Secondary | ICD-10-CM | POA: Diagnosis not present

## 2022-09-15 DIAGNOSIS — M1711 Unilateral primary osteoarthritis, right knee: Secondary | ICD-10-CM | POA: Diagnosis not present

## 2022-09-15 DIAGNOSIS — M2391 Unspecified internal derangement of right knee: Secondary | ICD-10-CM | POA: Diagnosis not present

## 2022-09-17 ENCOUNTER — Other Ambulatory Visit: Payer: Self-pay

## 2022-09-17 DIAGNOSIS — R7303 Prediabetes: Secondary | ICD-10-CM | POA: Diagnosis not present

## 2022-09-17 DIAGNOSIS — E663 Overweight: Secondary | ICD-10-CM | POA: Diagnosis not present

## 2022-09-17 DIAGNOSIS — Z79899 Other long term (current) drug therapy: Secondary | ICD-10-CM | POA: Diagnosis not present

## 2022-09-17 DIAGNOSIS — I10 Essential (primary) hypertension: Secondary | ICD-10-CM | POA: Diagnosis not present

## 2022-09-17 DIAGNOSIS — R5383 Other fatigue: Secondary | ICD-10-CM | POA: Diagnosis not present

## 2022-09-17 DIAGNOSIS — E538 Deficiency of other specified B group vitamins: Secondary | ICD-10-CM | POA: Diagnosis not present

## 2022-09-17 DIAGNOSIS — E782 Mixed hyperlipidemia: Secondary | ICD-10-CM | POA: Diagnosis not present

## 2022-09-17 DIAGNOSIS — D539 Nutritional anemia, unspecified: Secondary | ICD-10-CM | POA: Diagnosis not present

## 2022-09-17 DIAGNOSIS — F17210 Nicotine dependence, cigarettes, uncomplicated: Secondary | ICD-10-CM | POA: Diagnosis not present

## 2022-09-17 DIAGNOSIS — R635 Abnormal weight gain: Secondary | ICD-10-CM | POA: Diagnosis not present

## 2022-09-17 DIAGNOSIS — E559 Vitamin D deficiency, unspecified: Secondary | ICD-10-CM | POA: Diagnosis not present

## 2022-09-17 MED ORDER — LISINOPRIL 10 MG PO TABS
10.0000 mg | ORAL_TABLET | Freq: Every day | ORAL | 1 refills | Status: AC
  Filled 2022-09-17: qty 90, 90d supply, fill #0

## 2022-09-17 MED ORDER — ROSUVASTATIN CALCIUM 20 MG PO TABS
20.0000 mg | ORAL_TABLET | Freq: Every evening | ORAL | 1 refills | Status: AC
  Filled 2022-09-17: qty 90, 90d supply, fill #0

## 2022-09-17 MED ORDER — WEGOVY 2.4 MG/0.75ML ~~LOC~~ SOAJ
0.7500 mL | SUBCUTANEOUS | 0 refills | Status: AC
  Filled 2022-09-17: qty 3, 28d supply, fill #0

## 2022-09-17 MED ORDER — PHENTERMINE HCL 30 MG PO CAPS
ORAL_CAPSULE | ORAL | 0 refills | Status: DC
  Filled 2022-09-17: qty 30, 30d supply, fill #0

## 2022-09-24 DIAGNOSIS — M25561 Pain in right knee: Secondary | ICD-10-CM | POA: Diagnosis not present

## 2022-11-03 ENCOUNTER — Other Ambulatory Visit: Payer: Self-pay

## 2022-11-03 MED ORDER — WEGOVY 2.4 MG/0.75ML ~~LOC~~ SOAJ
2.4000 mg | SUBCUTANEOUS | 0 refills | Status: AC
  Filled 2022-11-03: qty 3, 28d supply, fill #0

## 2022-12-13 ENCOUNTER — Other Ambulatory Visit: Payer: Self-pay

## 2022-12-13 MED ORDER — PHENTERMINE HCL 30 MG PO CAPS
30.0000 mg | ORAL_CAPSULE | Freq: Every day | ORAL | 0 refills | Status: DC
  Filled 2022-12-13: qty 30, 30d supply, fill #0

## 2022-12-13 MED ORDER — OMEPRAZOLE 40 MG PO CPDR
40.0000 mg | DELAYED_RELEASE_CAPSULE | Freq: Every day | ORAL | 3 refills | Status: AC
  Filled 2022-12-13: qty 30, 30d supply, fill #0

## 2022-12-13 MED ORDER — WEGOVY 2.4 MG/0.75ML ~~LOC~~ SOAJ
2.4000 mg | SUBCUTANEOUS | 0 refills | Status: AC
  Filled 2022-12-13: qty 3, 28d supply, fill #0

## 2022-12-13 MED ORDER — FAMOTIDINE 40 MG PO TABS
40.0000 mg | ORAL_TABLET | Freq: Every day | ORAL | 1 refills | Status: AC
  Filled 2022-12-13: qty 90, 90d supply, fill #0

## 2022-12-25 ENCOUNTER — Other Ambulatory Visit: Payer: Self-pay

## 2023-01-14 ENCOUNTER — Other Ambulatory Visit: Payer: Self-pay

## 2023-01-14 MED ORDER — LISINOPRIL 10 MG PO TABS
10.0000 mg | ORAL_TABLET | Freq: Every day | ORAL | 1 refills | Status: AC
  Filled 2023-01-14: qty 90, 90d supply, fill #0

## 2023-01-14 MED ORDER — WEGOVY 2.4 MG/0.75ML ~~LOC~~ SOAJ
2.4000 mg | SUBCUTANEOUS | 0 refills | Status: AC
  Filled 2023-01-14: qty 3, 28d supply, fill #0

## 2023-02-08 ENCOUNTER — Other Ambulatory Visit: Payer: Self-pay

## 2023-02-08 MED ORDER — ROSUVASTATIN CALCIUM 20 MG PO TABS
20.0000 mg | ORAL_TABLET | Freq: Every evening | ORAL | 1 refills | Status: AC
  Filled 2023-02-08: qty 90, 90d supply, fill #0

## 2023-02-08 MED ORDER — FAMOTIDINE 40 MG PO TABS
40.0000 mg | ORAL_TABLET | Freq: Every day | ORAL | 1 refills | Status: AC
  Filled 2023-02-08: qty 90, 90d supply, fill #0

## 2023-02-08 MED ORDER — PHENTERMINE HCL 30 MG PO CAPS
30.0000 mg | ORAL_CAPSULE | Freq: Every day | ORAL | 0 refills | Status: AC
  Filled 2023-02-08: qty 30, 30d supply, fill #0

## 2023-02-08 MED ORDER — LISINOPRIL 10 MG PO TABS
10.0000 mg | ORAL_TABLET | Freq: Every day | ORAL | 1 refills | Status: AC
  Filled 2023-02-08: qty 90, 90d supply, fill #0

## 2023-02-08 MED ORDER — WEGOVY 2.4 MG/0.75ML ~~LOC~~ SOAJ
2.4000 mg | SUBCUTANEOUS | 0 refills | Status: AC
  Filled 2023-02-08: qty 3, 28d supply, fill #0

## 2023-02-08 MED ORDER — OMEPRAZOLE 40 MG PO CPDR
40.0000 mg | DELAYED_RELEASE_CAPSULE | Freq: Every day | ORAL | 3 refills | Status: AC
  Filled 2023-02-08: qty 30, 30d supply, fill #0

## 2023-02-09 ENCOUNTER — Other Ambulatory Visit: Payer: Self-pay

## 2023-04-09 ENCOUNTER — Other Ambulatory Visit: Payer: Self-pay

## 2023-04-09 MED ORDER — TRIAZOLAM 0.25 MG PO TABS
0.5000 mg | ORAL_TABLET | ORAL | 0 refills | Status: AC
  Filled 2023-04-16: qty 2, 1d supply, fill #0

## 2023-04-16 ENCOUNTER — Other Ambulatory Visit: Payer: Self-pay

## 2023-04-29 ENCOUNTER — Other Ambulatory Visit: Payer: Self-pay

## 2023-04-29 MED ORDER — DEXAMETHASONE 4 MG PO TABS
ORAL_TABLET | ORAL | 0 refills | Status: AC
  Filled 2023-04-29: qty 5, 3d supply, fill #0

## 2023-05-15 ENCOUNTER — Ambulatory Visit
Admission: EM | Admit: 2023-05-15 | Discharge: 2023-05-15 | Disposition: A | Discharge status: Home / Self Care | Payer: Commercial Managed Care - PPO | Attending: Emergency Medicine | Admitting: Emergency Medicine

## 2023-05-15 ENCOUNTER — Other Ambulatory Visit: Payer: Self-pay

## 2023-05-15 ENCOUNTER — Ambulatory Visit (INDEPENDENT_AMBULATORY_CARE_PROVIDER_SITE_OTHER): Payer: Commercial Managed Care - PPO

## 2023-05-15 DIAGNOSIS — F17200 Nicotine dependence, unspecified, uncomplicated: Secondary | ICD-10-CM

## 2023-05-15 DIAGNOSIS — J0100 Acute maxillary sinusitis, unspecified: Secondary | ICD-10-CM

## 2023-05-15 DIAGNOSIS — J209 Acute bronchitis, unspecified: Secondary | ICD-10-CM

## 2023-05-15 DIAGNOSIS — R058 Other specified cough: Secondary | ICD-10-CM | POA: Diagnosis not present

## 2023-05-15 MED ORDER — AMOXICILLIN 875 MG PO TABS
875.0000 mg | ORAL_TABLET | Freq: Two times a day (BID) | ORAL | 0 refills | Status: AC
  Filled 2023-05-15: qty 20, 10d supply, fill #0

## 2023-05-15 MED ORDER — PREDNISONE 10 MG PO TABS
ORAL_TABLET | ORAL | 0 refills | Status: AC
  Filled 2023-05-15: qty 21, 6d supply, fill #0

## 2023-05-15 MED ORDER — ALBUTEROL SULFATE HFA 108 (90 BASE) MCG/ACT IN AERS
1.0000 | INHALATION_SPRAY | Freq: Four times a day (QID) | RESPIRATORY_TRACT | 0 refills | Status: AC | PRN
  Filled 2023-05-15: qty 6.7, 25d supply, fill #0

## 2023-05-15 MED ORDER — PROMETHAZINE-DM 6.25-15 MG/5ML PO SYRP
5.0000 mL | ORAL_SOLUTION | Freq: Four times a day (QID) | ORAL | 0 refills | Status: AC | PRN
  Filled 2023-05-15: qty 118, 6d supply, fill #0

## 2023-05-15 NOTE — ED Provider Notes (Signed)
Sylvia Warren    CSN: [id number] Arrival date & time: 05/15/23  0955      History   Chief Complaint Chief Complaint  Patient presents with   Cough    HPI Sylvia Warren is a 42 y.o. female.  Patient presents with 1 week history of cough, congestion, runny nose, ear pain, sore throat.  Treating with Dayquil, Nyquil.  No fever, rash, shortness of breath, or other symptoms.  Her medical history includes hypertension.  Current everyday smoker.   [name]    Past Medical History:  Diagnosis Date   ADHD    COVID-19    Hypertension    Lumbar herniated disc    Migraine    Pain    Sciatica     Patient Active Problem List   Diagnosis Date Noted   Hyperlipidemia 11/21/2019   BMI 40.0-44.9, adult (HCC) 05/12/2019   Cough 02/03/2019   Fever 02/03/2019   Sciatica of left side 10/26/2018   Other complicated headache syndrome 07/12/2018   Great toe pain, left 06/16/2018   RUQ pain 05/03/2018   Vitamin D deficiency 02/10/2018   Migraine with status migrainosus, not intractable 02/10/2018   HTN, goal below 130/80 12/16/2017   Tobacco use disorder 12/16/2017   Healthcare maintenance 12/16/2017    Past Surgical History:  Procedure Laterality Date   CESAREAN SECTION     HYSTEROSCOPY     KNEE ARTHROSCOPY     TUBAL LIGATION      OB History     Gravida  3   Para  3   Term      Preterm      AB      Living  3      SAB      IAB      Ectopic      Multiple      Live Births               Home Medications    Prior to Admission medications   Medication Sig Start Date End Date Taking? Authorizing Provider  albuterol (VENTOLIN HFA) 108 (90 Base) MCG/ACT inhaler Inhale 1-2 puffs into the lungs every 6 (six) hours as needed. 05/15/23  Yes Mickie Bail, NP  amoxicillin (AMOXIL) 875 MG tablet Take 1 tablet (875 mg total) by mouth 2 (two) times daily for 10 days. 05/15/23 05/25/23 Yes Mickie Bail, NP   predniSONE (STERAPRED UNI-PAK 21 TAB) 10 MG (21) TBPK tablet Take by mouth daily. As directed 05/15/23  Yes Mickie Bail, NP  promethazine-dextromethorphan (PROMETHAZINE-DM) 6.25-15 MG/5ML syrup Take 5 mLs by mouth 4 (four) times daily as needed. 05/15/23  Yes Mickie Bail, NP  chlorhexidine (PERIDEX) 0.12 % solution Use as directed Patient not taking: Reported on 05/15/2023 07/25/22     famotidine (PEPCID) 40 MG tablet Take one tablet by mouth daily for heartburn Patient not taking: Reported on 05/15/2023 03/28/21     famotidine (PEPCID) 40 MG tablet 1 (one) Tablet daily for heartburn Patient not taking: Reported on 05/15/2023 11/14/21     famotidine (PEPCID) 40 MG tablet 1 (one) Tablet daily for heartburn Patient not taking: Reported on 05/15/2023 02/05/22     famotidine (PEPCID) 40 MG tablet Take 1 tablet (40 mg total) by mouth daily for heartburn. Patient not taking: Reported on 05/15/2023 12/13/22     famotidine (PEPCID) 40 MG tablet Take 1 tablet (40 mg total) by mouth daily  for heartburn. 02/07/23     HYDROcodone-acetaminophen (NORCO/VICODIN) 5-325 MG tablet Take one tablet by mouth every 6 hours as needed Patient not taking: Reported on 05/15/2023 07/25/22     ibuprofen (ADVIL,MOTRIN) 200 MG tablet Take 400 mg by mouth every 6 (six) hours as needed. Patient not taking: Reported on 11/02/2020    [provider]  lisinopril (ZESTRIL) 10 MG tablet TAKE 1 TABLET BY MOUTH DAILY Patient not taking: Reported on 05/15/2023 12/26/20 12/26/21  Courtney Paris, NP  lisinopril (ZESTRIL) 10 MG tablet TAKE 1 TABLET BY MOUTH DAILY. Patient not taking: Reported on 05/15/2023 06/21/20 06/21/21  Mayer Masker, PA-C  lisinopril (ZESTRIL) 10 MG tablet Take 1 tablet by mouth daily Patient not taking: Reported on 05/15/2023 03/28/21     lisinopril (ZESTRIL) 10 MG tablet Take 1 tablet by mouth daily Patient not taking: Reported on 05/15/2023 07/08/21     lisinopril (ZESTRIL) 10 MG tablet Take 1 tablet by mouth daily  11/14/21     lisinopril (ZESTRIL) 10 MG tablet Take 1 tablet by mouth daily Patient not taking: Reported on 05/15/2023 02/05/22     lisinopril (ZESTRIL) 10 MG tablet Take 1 tablet by mouth daily Patient not taking: Reported on 05/15/2023 09/17/22     lisinopril (ZESTRIL) 10 MG tablet Take 1 tablet (10 mg total) by mouth daily. Patient not taking: Reported on 05/15/2023 01/14/23     lisinopril (ZESTRIL) 10 MG tablet Take 1 tablet by mouth daily Patient not taking: Reported on 05/15/2023 02/07/23     medroxyPROGESTERone (PROVERA) 10 MG tablet TAKE 1 TABLET (10 MG TOTAL) BY MOUTH DAILY FOR 10 DAYS. 11/02/20 11/02/21  Conard Novak, MD  omeprazole (PRILOSEC) 40 MG capsule Take 1 (one) capsule by mouth daily for heartburn 07/08/21     omeprazole (PRILOSEC) 40 MG capsule 1 (one) Capsule daily for heartburn Patient not taking: Reported on 05/15/2023 11/14/21     omeprazole (PRILOSEC) 40 MG capsule 1 (one) Capsule daily for heartburn Patient not taking: Reported on 05/15/2023 02/05/22     omeprazole (PRILOSEC) 40 MG capsule 1 (one) Capsule daily for heartburn Patient not taking: Reported on 05/15/2023 07/29/22     omeprazole (PRILOSEC) 40 MG capsule Take 1 capsule (40 mg total) by mouth daily for heartburn. Patient not taking: Reported on 05/15/2023 12/13/22     omeprazole (PRILOSEC) 40 MG capsule Take 1 capsule (40 mg total) by mouth daily for heartburn. Patient not taking: Reported on 05/15/2023 02/07/23     phentermine 15 MG capsule 1 (one) capsule by mouth daily Patient not taking: Reported on 05/15/2023 02/05/22     phentermine 30 MG capsule Take 1 capsule (30 mg total) by mouth daily. Patient not taking: Reported on 05/15/2023 02/07/23     rizatriptan (MAXALT) 10 MG tablet TAKE 1 TABLET BY MOUTH DAILY AS NEEDED AS DIRECTED Patient not taking: Reported on 05/15/2023 12/26/20 12/26/21  Courtney Paris, NP  rizatriptan (MAXALT) 10 MG tablet Take 1 tablet by mouth daily Patient not taking: Reported on 05/15/2023  03/28/21     rizatriptan (MAXALT) 10 MG tablet Take 1 tablet by mouth daily Patient not taking: Reported on 05/15/2023 07/08/21     rizatriptan (MAXALT) 10 MG tablet Take 1 tablet by mouth daily Patient not taking: Reported on 05/15/2023 11/14/21     rizatriptan (MAXALT) 10 MG tablet Take 1 (one) Tablet by mouth daily Patient not taking: Reported on 05/15/2023 02/05/22     rizatriptan (MAXALT) 5 MG tablet Take 1 tablet (5 mg total)  by mouth as needed for migraine. May repeat in 2 hours if needed 06/21/20   Mayer Masker, PA-C  rosuvastatin (CRESTOR) 10 MG tablet Take 1 tablet by mouth three times weekly at bedtime. Patient not taking: Reported on 11/02/2020 06/21/20   Mayer Masker, PA-C  rosuvastatin (CRESTOR) 10 MG tablet TAKE 1 TABLET BY MOUTH IN THE EVENING FOR HIGH CHOLESTEROL Patient not taking: Reported on 05/15/2023 12/26/20 12/26/21  Courtney Paris, NP  rosuvastatin (CRESTOR) 10 MG tablet Take 1 tablet by mouth in the evening for high cholesterol Patient not taking: Reported on 05/15/2023 03/28/21     rosuvastatin (CRESTOR) 10 MG tablet 1 (one) Tablet in evening for high cholesterol Patient not taking: Reported on 05/15/2023 07/08/21     rosuvastatin (CRESTOR) 10 MG tablet 1 (one) Tablet in evening for high cholesterol Patient not taking: Reported on 05/15/2023 11/14/21     rosuvastatin (CRESTOR) 10 MG tablet 1 (one) Tablet in evening for high cholesterol Patient not taking: Reported on 05/15/2023 02/05/22     rosuvastatin (CRESTOR) 20 MG tablet 1 (one) Tablet in evening for high cholesterol Patient not taking: Reported on 05/15/2023 04/10/22     rosuvastatin (CRESTOR) 20 MG tablet Take 1 tablet (20 mg total) by mouth every evening for high cholesterol. Patient not taking: Reported on 05/15/2023 09/17/22     rosuvastatin (CRESTOR) 20 MG tablet Take 1 tablet (20 mg total) by mouth every evening for high cholesterol. 02/07/23     Semaglutide, 1 MG/DOSE, (OZEMPIC, 1 MG/DOSE,) 4 MG/3ML SOPN 1 (one) Milligram  under skin weekly Patient not taking: Reported on 05/15/2023 05/31/21     Semaglutide, 1 MG/DOSE, (OZEMPIC, 1 MG/DOSE,) 4 MG/3ML SOPN Inject 1 (one) Milligram under skin weekly Patient not taking: Reported on 05/15/2023 07/08/21     Semaglutide, 1 MG/DOSE, (OZEMPIC, 1 MG/DOSE,) 4 MG/3ML SOPN 1 (one) Milligram under skin weekly Patient not taking: Reported on 05/15/2023 11/14/21     Semaglutide-Weight Management (WEGOVY) 0.25 MG/0.5ML SOAJ 0.25 Milligram under skin weekly Patient not taking: Reported on 05/15/2023 03/17/22     Semaglutide-Weight Management (WEGOVY) 0.5 MG/0.5ML SOAJ 0.5 Milliliter under skin weekly Patient not taking: Reported on 05/15/2023 04/10/22     Semaglutide-Weight Management (WEGOVY) 1 MG/0.5ML SOAJ 1 (one) Milligram under skin weekly Patient not taking: Reported on 05/15/2023 05/23/22     Semaglutide-Weight Management (WEGOVY) 1.7 MG/0.75ML SOAJ 1.7 milligram under skin weekly Patient not taking: Reported on 05/15/2023 06/27/22     Semaglutide-Weight Management (WEGOVY) 2.4 MG/0.75ML SOAJ 0.5 Milliliter under skin weekly Patient not taking: Reported on 05/15/2023 07/29/22     Semaglutide-Weight Management (WEGOVY) 2.4 MG/0.75ML SOAJ Inject 2.4 mg into the skin once a week. Patient not taking: Reported on 05/15/2023 09/17/22     Semaglutide-Weight Management (WEGOVY) 2.4 MG/0.75ML SOAJ Inject 2.4 mg into the skin once a week. Patient not taking: Reported on 05/15/2023 11/03/22     Semaglutide-Weight Management (WEGOVY) 2.4 MG/0.75ML SOAJ Inject 2.4 mg into the skin once a week. Patient not taking: Reported on 05/15/2023 12/13/22     Semaglutide-Weight Management (WEGOVY) 2.4 MG/0.75ML SOAJ Inject 2.4 mg into the skin once a week. Patient not taking: Reported on 05/15/2023 01/14/23     Semaglutide-Weight Management (WEGOVY) 2.4 MG/0.75ML SOAJ Inject 2.4 mg into the skin once a week. Patient not taking: Reported on 05/15/2023 02/07/23     topiramate (TOPAMAX) 25 MG tablet TAKE 1 TABLET BY MOUTH  DAILY 12/26/20 12/26/21  Courtney Paris, NP  topiramate (TOPAMAX) 50 MG tablet TAKE 1 TABLET  BY MOUTH DAILY Patient not taking: Reported on 05/15/2023 01/23/21 01/23/22  Courtney Paris, NP  topiramate (TOPAMAX) 50 MG tablet Take 1 tablet by mouth daily Patient not taking: Reported on 05/15/2023 05/01/21     topiramate (TOPAMAX) 50 MG tablet Take 1 tablet by mouth daily Patient not taking: Reported on 05/15/2023 07/08/21     topiramate (TOPAMAX) 50 MG tablet Take 1 tablet by mouth daily Patient not taking: Reported on 05/15/2023 11/14/21     topiramate (TOPAMAX) 50 MG tablet Take 1 tablet by mouth daily Patient not taking: Reported on 05/15/2023 02/05/22     topiramate (TOPAMAX) 50 MG tablet Take 1 tablet by mouth daily Patient not taking: Reported on 05/15/2023 05/23/22     triazolam (HALCION) 0.25 MG tablet Take 2 tablets by mouth 1 hour before procedure Patient not taking: Reported on 05/15/2023 07/16/22     triazolam (HALCION) 0.25 MG tablet Take 2 tablets (0.5 mg total) by mouth 1 hour before procedure. Patient not taking: Reported on 05/15/2023 02/26/23     Vitamin D, Ergocalciferol, (DRISDOL) 1.25 MG (50000 UNIT) CAPS capsule Take 1 (one) Capsule by mouth weekly Patient not taking: Reported on 05/15/2023 07/08/21     Vitamin D, Ergocalciferol, (DRISDOL) 1.25 MG (50000 UNIT) CAPS capsule Take one capsule by mouth weekly Patient not taking: Reported on 05/15/2023 11/14/21     Vitamin D, Ergocalciferol, (DRISDOL) 1.25 MG (50000 UNIT) CAPS capsule Take 1 capsule by mouth once weekly Patient not taking: Reported on 05/15/2023 02/05/22   Courtney Paris, NP    Family History Family History  Problem Relation Age of Onset   Hypertension Mother    Cirrhosis Mother    Kidney disease Mother    Obesity Mother    Healthy Sister    Healthy Daughter    Healthy Son    Cancer Maternal Aunt        vulvar   Diabetes Paternal Grandmother    Healthy Sister    Healthy Sister    Healthy Son    Uterine cancer Other 35     Social History Social History   Tobacco Use   Smoking status: Every Day    Packs/day: 1.00    Years: 18.00    Additional pack years: 0.00    Total pack years: 18.00    Types: Cigarettes   Smokeless tobacco: Never  Vaping Use   Vaping Use: Never used  Substance Use Topics   Alcohol use: No   Drug use: No     Allergies   Patient has no known allergies.   Review of Systems Review of Systems  Constitutional:  Negative for chills and fever.  HENT:  Positive for congestion, ear pain, postnasal drip, rhinorrhea and sore throat.   Respiratory:  Positive for cough. Negative for shortness of breath.   Cardiovascular:  Negative for chest pain and palpitations.  Gastrointestinal:  Negative for diarrhea and vomiting.     Physical Exam Triage Vital Signs ED Triage Vitals  Enc Vitals Group     BP      Pulse      Resp      Temp      Temp src      SpO2      Weight      Height      Head Circumference      Peak Flow      Pain Score      Pain Loc      Pain Edu?  Excl. in GC?    No data found.  Updated Vital Signs BP 132/80   Pulse 83   Temp 98 F (36.7 C)   Resp 18   LMP 12/14/2020   SpO2 95%   Visual Acuity Right Eye Distance:   Left Eye Distance:   Bilateral Distance:    Right Eye Near:   Left Eye Near:    Bilateral Near:     Physical Exam Vitals and nursing note reviewed.  Constitutional:      General: She is not in acute distress.    Appearance: She is well-developed. She is ill-appearing.  HENT:     Right Ear: Tympanic membrane normal.     Left Ear: Tympanic membrane normal.     Nose: Congestion present.     Mouth/Throat:     Mouth: Mucous membranes are moist.     Pharynx: Oropharynx is clear.  Cardiovascular:     Rate and Rhythm: Normal rate and regular rhythm.     Heart sounds: Normal heart sounds.  Pulmonary:     Effort: Pulmonary effort is normal. No respiratory distress.     Breath sounds: Wheezing and rhonchi present.      Comments: Bilateral rhonchi and wheezes Musculoskeletal:     Cervical back: Neck supple.  Skin:    General: Skin is warm and dry.  Neurological:     Mental Status: She is alert.  Psychiatric:        Mood and Affect: Mood normal.        Behavior: Behavior normal.      UC Treatments / Results  Labs (all labs ordered are listed, but only abnormal results are displayed) Labs Reviewed - No data to display  EKG   Radiology DG Chest 2 View  Result Date: 05/15/2023 CLINICAL DATA:  productive cough. smoker EXAM: CHEST - 2 VIEW COMPARISON:  None Available. FINDINGS: No pleural effusion. No pneumothorax. Normal cardiac and mediastinal contours. No focal airspace opacity. No radiographically apparent displaced rib fractures. Visualized upper abdomen is unremarkable. Vertebral body heights are maintained IMPRESSION: No focal airspace opacity. Electronically Signed   By: Lorenza Cambridge M.D.   On: 05/15/2023 10:24    Procedures Procedures (including critical care time)  Medications Ordered in UC Medications - No data to display  Initial Impression / Assessment and Plan / UC Course  I have reviewed the triage vital signs and the nursing notes.  Pertinent labs & imaging results that were available during my care of the patient were reviewed by me and considered in my medical decision making (see chart for details).    Productive cough, acute bronchitis, acute sinusitis, current everyday smoker.  Chest x-ray negative.  Treating with albuterol inhaler, prednisone, amoxicillin.  Education provided on bronchitis.  Treating cough with Promethazine DM; precautions for drowsiness with this medication discussed.  Encourage patient to quit smoking and education provided on steps to quit smoking.  Instructed her to follow-up with her PCP.  She agrees to plan of care.  Final Clinical Impressions(s) / UC Diagnoses   Final diagnoses:  Productive cough  Acute bronchitis, unspecified organism  Acute  non-recurrent maxillary sinusitis  Current smoker     Discharge Instructions      Take the prednisone, amoxicillin, albuterol inhaler, and promethazine DM as directed.  Follow up with your primary care provider.        ED Prescriptions     Medication Sig Dispense Auth. Provider   amoxicillin (AMOXIL) 875 MG tablet Take 1  tablet (875 mg total) by mouth 2 (two) times daily for 10 days. 20 tablet Mickie Bail, NP   promethazine-dextromethorphan (PROMETHAZINE-DM) 6.25-15 MG/5ML syrup Take 5 mLs by mouth 4 (four) times daily as needed. 118 mL Mickie Bail, NP   predniSONE (STERAPRED UNI-PAK 21 TAB) 10 MG (21) TBPK tablet Take by mouth daily. As directed 21 tablet Mickie Bail, NP   albuterol (VENTOLIN HFA) 108 (90 Base) MCG/ACT inhaler Inhale 1-2 puffs into the lungs every 6 (six) hours as needed. 18 g Mickie Bail, NP      PDMP not reviewed this encounter.   Mickie Bail, NP 05/15/23 [phone]

## 2023-05-15 NOTE — ED Triage Notes (Signed)
Patient to Urgent Care with complaints of cough/ bilateral ear fullness/ sore throat/ nasal congestion and drainage. Cough is productive at times.   Symptoms started one week ago. Possible fevers on Monday and Tuesday.  Has been taking dayquil and nyquil/ ibuprofen.

## 2023-05-15 NOTE — Discharge Instructions (Addendum)
Take the prednisone, amoxicillin, albuterol inhaler, and promethazine DM as directed.  Follow up with your primary care provider.

## 2023-06-26 DIAGNOSIS — H5203 Hypermetropia, bilateral: Secondary | ICD-10-CM | POA: Diagnosis not present

## 2023-08-27 DIAGNOSIS — R7303 Prediabetes: Secondary | ICD-10-CM | POA: Diagnosis not present

## 2023-08-27 DIAGNOSIS — I10 Essential (primary) hypertension: Secondary | ICD-10-CM | POA: Diagnosis not present

## 2023-08-27 DIAGNOSIS — Z1331 Encounter for screening for depression: Secondary | ICD-10-CM | POA: Diagnosis not present

## 2023-08-27 DIAGNOSIS — E782 Mixed hyperlipidemia: Secondary | ICD-10-CM | POA: Diagnosis not present

## 2023-08-27 DIAGNOSIS — Z7182 Exercise counseling: Secondary | ICD-10-CM | POA: Diagnosis not present

## 2023-08-27 DIAGNOSIS — Z6834 Body mass index (BMI) 34.0-34.9, adult: Secondary | ICD-10-CM | POA: Diagnosis not present

## 2023-08-27 DIAGNOSIS — Z713 Dietary counseling and surveillance: Secondary | ICD-10-CM | POA: Diagnosis not present

## 2023-08-27 DIAGNOSIS — E66811 Obesity, class 1: Secondary | ICD-10-CM | POA: Diagnosis not present

## 2023-08-27 DIAGNOSIS — E6609 Other obesity due to excess calories: Secondary | ICD-10-CM | POA: Diagnosis not present

## 2023-11-07 ENCOUNTER — Ambulatory Visit
Admission: EM | Admit: 2023-11-07 | Discharge: 2023-11-07 | Disposition: A | Discharge status: Home / Self Care | Payer: Commercial Managed Care - PPO

## 2023-11-07 DIAGNOSIS — Z8616 Personal history of COVID-19: Secondary | ICD-10-CM | POA: Insufficient documentation

## 2023-11-07 DIAGNOSIS — F17210 Nicotine dependence, cigarettes, uncomplicated: Secondary | ICD-10-CM | POA: Diagnosis not present

## 2023-11-07 DIAGNOSIS — B349 Viral infection, unspecified: Secondary | ICD-10-CM | POA: Insufficient documentation

## 2023-11-07 DIAGNOSIS — B029 Zoster without complications: Secondary | ICD-10-CM | POA: Diagnosis not present

## 2023-11-07 LAB — POCT INFLUENZA A/B
Influenza A, POC: NEGATIVE
Influenza B, POC: NEGATIVE

## 2023-11-07 MED ORDER — VALACYCLOVIR HCL 1 G PO TABS: 1000.0000 mg | ORAL_TABLET | Freq: Three times a day (TID) | ORAL | 0 refills | Status: AC

## 2023-11-07 NOTE — ED Triage Notes (Signed)
"  My main thing is a rash that I have on my right buttocks with a lot of itching that I started noticing on Tuesday". "May be spreading". "On top of that having chills, ha's that started Thursday".

## 2023-11-10 LAB — SARS CORONAVIRUS 2 (TAT 6-24 HRS): SARS Coronavirus 2: NEGATIVE

## 2024-01-13 ENCOUNTER — Other Ambulatory Visit: Payer: Self-pay | Admitting: Medical Genetics

## 2024-01-14 ENCOUNTER — Other Ambulatory Visit: Payer: Self-pay

## 2024-01-16 ENCOUNTER — Other Ambulatory Visit: Payer: Self-pay

## 2024-09-19 ENCOUNTER — Other Ambulatory Visit: Payer: Self-pay | Admitting: Medical Genetics

## 2024-09-19 DIAGNOSIS — Z006 Encounter for examination for normal comparison and control in clinical research program: Secondary | ICD-10-CM

## 2024-10-19 LAB — GENECONNECT MOLECULAR SCREEN: Genetic Analysis Overall Interpretation: NEGATIVE

## 2024-11-25 ENCOUNTER — Other Ambulatory Visit: Payer: Self-pay

## 2024-11-25 MED ORDER — DOXYCYCLINE HYCLATE 100 MG PO TABS
100.0000 mg | ORAL_TABLET | Freq: Every day | ORAL | 1 refills | Status: AC
  Filled 2024-11-25: qty 31, 31d supply, fill #0

## 2024-11-25 MED ORDER — NEOMYCIN-POLYMYXIN-DEXAMETH 0.1 % OP SUSP
1.0000 [drp] | Freq: Three times a day (TID) | OPHTHALMIC | 0 refills | Status: AC
  Filled 2024-11-25: qty 5, 10d supply, fill #0

## 2024-12-06 ENCOUNTER — Other Ambulatory Visit: Payer: Self-pay
